# Patient Record
Sex: Male | Born: 1935
Health system: Southern US, Community
[De-identification: ages and names within clinical notes are randomized; demographics above are authoritative.]

## PROBLEM LIST (undated history)

## (undated) DIAGNOSIS — R0602 Shortness of breath: Secondary | ICD-10-CM

## (undated) DIAGNOSIS — I2510 Atherosclerotic heart disease of native coronary artery without angina pectoris: Secondary | ICD-10-CM

## (undated) DIAGNOSIS — K219 Gastro-esophageal reflux disease without esophagitis: Secondary | ICD-10-CM

## (undated) DIAGNOSIS — H269 Unspecified cataract: Secondary | ICD-10-CM

## (undated) DIAGNOSIS — I10 Essential (primary) hypertension: Secondary | ICD-10-CM

## (undated) DIAGNOSIS — M1990 Unspecified osteoarthritis, unspecified site: Secondary | ICD-10-CM

## (undated) DIAGNOSIS — J449 Chronic obstructive pulmonary disease, unspecified: Secondary | ICD-10-CM

## (undated) DIAGNOSIS — C801 Malignant (primary) neoplasm, unspecified: Secondary | ICD-10-CM

## (undated) DIAGNOSIS — D649 Anemia, unspecified: Secondary | ICD-10-CM

## (undated) DIAGNOSIS — Z87442 Personal history of urinary calculi: Secondary | ICD-10-CM

## (undated) DIAGNOSIS — E7800 Pure hypercholesterolemia, unspecified: Secondary | ICD-10-CM

## (undated) HISTORY — PX: TONSILLECTOMY: SUR1361

## (undated) HISTORY — DX: Unspecified cataract: H26.9

## (undated) HISTORY — PX: OTHER SURGICAL HISTORY: SHX169

## (undated) HISTORY — PX: EYE SURGERY: SHX253

---

## 1989-05-26 HISTORY — PX: CORONARY ARTERY BYPASS GRAFT: SHX141

## 2009-05-26 HISTORY — PX: CORONARY ANGIOPLASTY: SHX604

## 2011-06-26 DIAGNOSIS — I10 Essential (primary) hypertension: Secondary | ICD-10-CM | POA: Diagnosis not present

## 2011-06-26 DIAGNOSIS — E782 Mixed hyperlipidemia: Secondary | ICD-10-CM | POA: Diagnosis not present

## 2011-06-26 DIAGNOSIS — I2510 Atherosclerotic heart disease of native coronary artery without angina pectoris: Secondary | ICD-10-CM | POA: Diagnosis not present

## 2011-07-28 DIAGNOSIS — E119 Type 2 diabetes mellitus without complications: Secondary | ICD-10-CM | POA: Diagnosis not present

## 2012-03-05 DIAGNOSIS — H2510 Age-related nuclear cataract, unspecified eye: Secondary | ICD-10-CM | POA: Diagnosis not present

## 2012-03-05 DIAGNOSIS — H524 Presbyopia: Secondary | ICD-10-CM | POA: Diagnosis not present

## 2012-03-05 DIAGNOSIS — H25019 Cortical age-related cataract, unspecified eye: Secondary | ICD-10-CM | POA: Diagnosis not present

## 2012-03-05 DIAGNOSIS — H43399 Other vitreous opacities, unspecified eye: Secondary | ICD-10-CM | POA: Diagnosis not present

## 2012-04-06 DIAGNOSIS — E782 Mixed hyperlipidemia: Secondary | ICD-10-CM | POA: Diagnosis not present

## 2012-04-06 DIAGNOSIS — L570 Actinic keratosis: Secondary | ICD-10-CM | POA: Diagnosis not present

## 2012-04-12 DIAGNOSIS — H2589 Other age-related cataract: Secondary | ICD-10-CM | POA: Diagnosis not present

## 2012-04-12 DIAGNOSIS — H35369 Drusen (degenerative) of macula, unspecified eye: Secondary | ICD-10-CM | POA: Diagnosis not present

## 2012-04-13 ENCOUNTER — Encounter (HOSPITAL_COMMUNITY): Payer: Self-pay | Admitting: Pharmacy Technician

## 2012-04-16 ENCOUNTER — Encounter (HOSPITAL_COMMUNITY)
Admission: RE | Admit: 2012-04-16 | Discharge: 2012-04-16 | Disposition: A | Discharge status: Home / Self Care | Payer: Medicare Other | Source: Ambulatory Visit | Attending: Ophthalmology | Admitting: Ophthalmology

## 2012-04-16 ENCOUNTER — Encounter (HOSPITAL_COMMUNITY): Payer: Self-pay

## 2012-04-16 ENCOUNTER — Other Ambulatory Visit: Payer: Self-pay

## 2012-04-16 DIAGNOSIS — H2589 Other age-related cataract: Secondary | ICD-10-CM | POA: Diagnosis not present

## 2012-04-16 DIAGNOSIS — J449 Chronic obstructive pulmonary disease, unspecified: Secondary | ICD-10-CM | POA: Diagnosis not present

## 2012-04-16 DIAGNOSIS — I10 Essential (primary) hypertension: Secondary | ICD-10-CM | POA: Diagnosis not present

## 2012-04-16 HISTORY — DX: Chronic obstructive pulmonary disease, unspecified: J44.9

## 2012-04-16 LAB — BASIC METABOLIC PANEL
BUN: 24 mg/dL — ABNORMAL HIGH (ref 6–23)
Creatinine, Ser: 1.15 mg/dL (ref 0.50–1.35)

## 2012-04-16 LAB — HEMOGLOBIN AND HEMATOCRIT, BLOOD
HCT: 39.1 % (ref 39.0–52.0)
Hemoglobin: 13.8 g/dL (ref 13.0–17.0)

## 2012-04-16 NOTE — Patient Instructions (Addendum)
Your procedure is scheduled on:  04/29/2012  Report to [facility] at  7:00    AM.  Call this number if you have problems the morning of surgery: [phone]   Remember:   Do not eat or drink :After Midnight.    Take these medicines the morning of surgery with A SIP OF WATER: Amlodipine, Lisinopril, Theodur and use albuterol inhaler   Do not wear jewelry, make-up or nail polish.  Do not wear lotions, powders, or perfumes. You may wear deodorant.  Do not shave 48 hours prior to surgery.  Do not bring valuables to the hospital.  Contacts, dentures or bridgework may not be worn into surgery.  Patients discharged the day of surgery will not be allowed to drive home.  Name and phone number of your driver:    Please read over the following fact sheets that you were given: Pain Booklet, Surgical Site Infection Prevention, Anesthesia Post-op Instructions and Care and Recovery After Surgery  Cataract Surgery  A cataract is a clouding of the lens of the eye. When a lens becomes cloudy, vision is reduced based on the degree and nature of the clouding. Surgery may be needed to improve vision. Surgery removes the cloudy lens and usually replaces it with a substitute lens (intraocular lens, IOL). LET YOUR EYE DOCTOR KNOW ABOUT:  Allergies to food or medicine.   Medicines taken including herbs, eyedrops, over-the-counter medicines, and creams.   Use of steroids (by mouth or creams).   Previous problems with anesthetics or numbing medicine.   History of bleeding problems or blood clots.   Previous surgery.   Other health problems, including diabetes and kidney problems.   Possibility of pregnancy, if this applies.  RISKS AND COMPLICATIONS  Infection.   Inflammation of the eyeball (endophthalmitis) that can spread to both eyes (sympathetic ophthalmia).   Poor wound healing.   If an IOL is inserted, it can later fall out of proper position. This is very uncommon.   Clouding of the part  of your eye that holds an IOL in place. This is called an "after-cataract." These are uncommon, but easily treated.  BEFORE THE PROCEDURE  Do not eat or drink anything except small amounts of water for 8 to 12 before your surgery, or as directed by your caregiver.   Unless you are told otherwise, continue any eyedrops you have been prescribed.   Talk to your primary caregiver about all other medicines that you take (both prescription and non-prescription). In some cases, you may need to stop or change medicines near the time of your surgery. This is most important if you are taking blood-thinning medicine.Do not stop medicines unless you are told to do so.   Arrange for someone to drive you to and from the procedure.   Do not put contact lenses in either eye on the day of your surgery.  PROCEDURE There is more than one method for safely removing a cataract. Your doctor can explain the differences and help determine which is best for you. Phacoemulsification surgery is the most common form of cataract surgery.  An injection is given behind the eye or eyedrops are given to make this a painless procedure.   A small cut (incision) is made on the edge of the clear, dome-shaped surface that covers the front of the eye (cornea).   A tiny probe is painlessly inserted into the eye. This device gives off ultrasound waves that soften and break up the cloudy center of the  lens. This makes it easier for the cloudy lens to be removed by suction.   An IOL may be implanted.   The normal lens of the eye is covered by a clear capsule. Part of that capsule is intentionally left in the eye to support the IOL.   Your surgeon may or may not use stitches to close the incision.  There are other forms of cataract surgery that require a larger incision and stiches to close the eye. This approach is taken in cases where the doctor feels that the cataract cannot be easily removed using phacoemulsification. AFTER  THE PROCEDURE  When an IOL is implanted, it does not need care. It becomes a permanent part of your eye and cannot be seen or felt.   Your doctor will schedule follow-up exams to check on your progress.   Review your other medicines with your doctor to see which can be resumed after surgery.   Use eyedrops or take medicine as prescribed by your doctor.  Document Released: 05/01/2011 Document Reviewed: 04/28/2011 [facility] Patient Information 2012 [place], [place].  .Cataract Surgery Care After Refer to this sheet in the next few weeks. These instructions provide you with information on caring for yourself after your procedure. Your caregiver may also give you more specific instructions. Your treatment has been planned according to current medical practices, but problems sometimes occur. Call your caregiver if you have any problems or questions after your procedure.  HOME CARE INSTRUCTIONS   Avoid strenuous activities as directed by your caregiver.   Ask your caregiver when you can resume driving.   Use eyedrops or other medicines to help healing and control pressure inside your eye as directed by your caregiver.   Only take over-the-counter or prescription medicines for pain, discomfort, or fever as directed by your caregiver.   Do not to touch or rub your eyes.   You may be instructed to use a protective shield during the first few days and nights after surgery. If not, wear sunglasses to protect your eyes. This is to protect the eye from pressure or from being accidentally bumped.   Keep the area around your eye clean and dry. Avoid swimming or allowing water to hit you directly in the face while showering. Keep soap and shampoo out of your eyes.   Do not bend or lift heavy objects. Bending increases pressure in the eye. You can walk, climb stairs, and do light household chores.   Do not put a contact lens into the eye that had surgery until your caregiver says it is okay to do so.     Ask your doctor when you can return to work. This will depend on the kind of work that you do. If you work in a dusty environment, you may be advised to wear protective eyewear for a period of time.   Ask your caregiver when it will be safe to engage in sexual activity.   Continue with your regular eye exams as directed by your caregiver.  What to expect:  It is normal to feel itching and mild discomfort for a few days after cataract surgery. Some fluid discharge is also common, and your eye may be sensitive to light and touch.   After 1 to 2 days, even moderate discomfort should disappear. In most cases, healing will take about 6 weeks.   If you received an intraocular lens (IOL), you may notice that colors are very bright or have a blue tinge. Also, if you have  been in bright sunlight, everything may appear reddish for a few hours. If you see these color tinges, it is because your lens is clear and no longer cloudy. Within a few months after receiving an IOL, these extra colors should go away. When you have healed, you will probably need new glasses.  SEEK MEDICAL CARE IF:   You have increased bruising around your eye.   You have discomfort not helped by medicine.  SEEK IMMEDIATE MEDICAL CARE IF:   You have a fever.   You have a worsening or sudden vision loss.   You have redness, swelling, or increasing pain in the eye.   You have a thick discharge from the eye that had surgery.  MAKE SURE YOU:  Understand these instructions.   Will watch your condition.   Will get help right away if you are not doing well or get worse.  Document Released: 11/29/2004 Document Revised: 05/01/2011 Document Reviewed: 01/03/2011 [facility] Patient Information 2012 [place], [place].

## 2012-04-28 MED ORDER — TETRACAINE HCL 0.5 % OP SOLN
OPHTHALMIC | Status: AC
  Filled 2012-04-28: qty 2

## 2012-04-28 MED ORDER — LIDOCAINE HCL 3.5 % OP GEL
OPHTHALMIC | Status: AC
  Filled 2012-04-28: qty 5

## 2012-04-28 MED ORDER — PHENYLEPHRINE HCL 2.5 % OP SOLN
OPHTHALMIC | Status: AC
  Filled 2012-04-28: qty 2

## 2012-04-28 MED ORDER — LIDOCAINE HCL (PF) 1 % IJ SOLN
INTRAMUSCULAR | Status: AC
  Filled 2012-04-28: qty 2

## 2012-04-28 MED ORDER — NEOMYCIN-POLYMYXIN-DEXAMETH 3.5-10000-0.1 OP OINT
TOPICAL_OINTMENT | OPHTHALMIC | Status: AC
  Filled 2012-04-28: qty 3.5

## 2012-04-28 MED ORDER — CYCLOPENTOLATE-PHENYLEPHRINE 0.2-1 % OP SOLN
OPHTHALMIC | Status: AC
  Filled 2012-04-28: qty 2

## 2012-04-29 ENCOUNTER — Encounter (HOSPITAL_COMMUNITY): Payer: Self-pay | Admitting: *Deleted

## 2012-04-29 ENCOUNTER — Encounter (HOSPITAL_COMMUNITY): Payer: Self-pay | Admitting: Anesthesiology

## 2012-04-29 ENCOUNTER — Ambulatory Visit (HOSPITAL_COMMUNITY)
Admission: RE | Admit: 2012-04-29 | Discharge: 2012-04-29 | Disposition: A | Discharge status: Home / Self Care | Payer: Medicare Other | Source: Ambulatory Visit | Attending: Ophthalmology | Admitting: Ophthalmology

## 2012-04-29 ENCOUNTER — Ambulatory Visit (HOSPITAL_COMMUNITY): Payer: Medicare Other | Admitting: Anesthesiology

## 2012-04-29 ENCOUNTER — Encounter (HOSPITAL_COMMUNITY)
Admission: RE | Disposition: A | Discharge status: Home / Self Care | Payer: Self-pay | Source: Ambulatory Visit | Attending: Ophthalmology

## 2012-04-29 ENCOUNTER — Encounter (HOSPITAL_COMMUNITY): Payer: Self-pay | Admitting: Ophthalmology

## 2012-04-29 DIAGNOSIS — I10 Essential (primary) hypertension: Secondary | ICD-10-CM | POA: Diagnosis not present

## 2012-04-29 DIAGNOSIS — H2589 Other age-related cataract: Secondary | ICD-10-CM | POA: Insufficient documentation

## 2012-04-29 DIAGNOSIS — J4489 Other specified chronic obstructive pulmonary disease: Secondary | ICD-10-CM | POA: Insufficient documentation

## 2012-04-29 DIAGNOSIS — J449 Chronic obstructive pulmonary disease, unspecified: Secondary | ICD-10-CM | POA: Insufficient documentation

## 2012-04-29 DIAGNOSIS — H269 Unspecified cataract: Secondary | ICD-10-CM | POA: Diagnosis not present

## 2012-04-29 HISTORY — PX: CATARACT EXTRACTION W/PHACO: SHX586

## 2012-04-29 SURGERY — PHACOEMULSIFICATION, CATARACT, WITH IOL INSERTION
Anesthesia: Monitor Anesthesia Care | Site: Eye | Laterality: Right | Wound class: Clean

## 2012-04-29 MED ORDER — CYCLOPENTOLATE-PHENYLEPHRINE 0.2-1 % OP SOLN
1.0000 [drp] | OPHTHALMIC | Status: AC
  Administered 2012-04-29 (×3): 1 [drp] via OPHTHALMIC

## 2012-04-29 MED ORDER — PHENYLEPHRINE HCL 2.5 % OP SOLN
1.0000 [drp] | OPHTHALMIC | Status: AC
Start: 2012-04-29 — End: 2012-04-29
  Administered 2012-04-29 (×3): 1 [drp] via OPHTHALMIC

## 2012-04-29 MED ORDER — POVIDONE-IODINE 5 % OP SOLN
OPHTHALMIC | Status: DC | PRN
  Administered 2012-04-29: 1 via OPHTHALMIC

## 2012-04-29 MED ORDER — LACTATED RINGERS IV SOLN
INTRAVENOUS | Status: DC | PRN
  Administered 2012-04-29: 08:00:00 via INTRAVENOUS

## 2012-04-29 MED ORDER — PROVISC 10 MG/ML IO SOLN
INTRAOCULAR | Status: DC | PRN
  Administered 2012-04-29: 8.5 mg via INTRAOCULAR

## 2012-04-29 MED ORDER — MIDAZOLAM HCL 2 MG/2ML IJ SOLN
1.0000 mg | INTRAMUSCULAR | Status: DC | PRN
  Administered 2012-04-29: 2 mg via INTRAVENOUS

## 2012-04-29 MED ORDER — TETRACAINE HCL 0.5 % OP SOLN
1.0000 [drp] | OPHTHALMIC | Status: AC
  Administered 2012-04-29 (×3): 1 [drp] via OPHTHALMIC

## 2012-04-29 MED ORDER — EPINEPHRINE HCL 1 MG/ML IJ SOLN
INTRAMUSCULAR | Status: AC
  Filled 2012-04-29: qty 1

## 2012-04-29 MED ORDER — LIDOCAINE 3.5 % OP GEL OPTIME - NO CHARGE
OPHTHALMIC | Status: DC | PRN
  Administered 2012-04-29: 1 [drp] via OPHTHALMIC

## 2012-04-29 MED ORDER — LIDOCAINE HCL (PF) 1 % IJ SOLN
INTRAMUSCULAR | Status: DC | PRN
  Administered 2012-04-29: .7 mL

## 2012-04-29 MED ORDER — MIDAZOLAM HCL 2 MG/2ML IJ SOLN
INTRAMUSCULAR | Status: AC
Start: 2012-04-29 — End: 2012-04-29
  Filled 2012-04-29: qty 2

## 2012-04-29 MED ORDER — NEOMYCIN-POLYMYXIN-DEXAMETH 0.1 % OP OINT
TOPICAL_OINTMENT | OPHTHALMIC | Status: DC | PRN
  Administered 2012-04-29: 1 via OPHTHALMIC

## 2012-04-29 MED ORDER — LIDOCAINE HCL 3.5 % OP GEL
1.0000 "application " | Freq: Once | OPHTHALMIC | Status: AC
  Administered 2012-04-29: 1 via OPHTHALMIC

## 2012-04-29 MED ORDER — BSS IO SOLN
INTRAOCULAR | Status: DC | PRN
  Administered 2012-04-29: 15 mL via INTRAOCULAR

## 2012-04-29 MED ORDER — LACTATED RINGERS IV SOLN
INTRAVENOUS | Status: DC
  Administered 2012-04-29: 1000 mL via INTRAVENOUS

## 2012-04-29 MED ORDER — EPINEPHRINE HCL 1 MG/ML IJ SOLN
INTRAOCULAR | Status: DC | PRN
  Administered 2012-04-29: 08:00:00

## 2012-04-29 SURGICAL SUPPLY — 32 items

## 2012-04-29 NOTE — Op Note (Signed)
NAME:  Gabriel Mcdonald, [name]                 ACCOUNT NO.:  [id number]  MEDICAL RECORD NO.:  [id number]  LOCATION:  [facility]                          FACILITY:  [facility]  PHYSICIAN:  Susanne Greenhouse, MD       DATE OF BIRTH:  13-Mar-1932  DATE OF PROCEDURE:  04/29/2012 DATE OF DISCHARGE:  04/29/2012                              OPERATIVE REPORT   PREOPERATIVE DIAGNOSIS:  Combined cataract, right eye, diagnosis code 366.19.  POSTOPERATIVE DIAGNOSIS:  Combined cataract, right eye, diagnosis code 366.19.  OPERATION PERFORMED:  Phacoemulsification with posterior chamber intraocular lens implantation, right eye.  SURGEON:  Bonne Dolores. Daymond Cordts, MD  ANESTHESIA:  Topical with monitored anesthesia care and IV sedation.  OPERATIVE SUMMARY:  In the preoperative area, dilating drops were placed into the right eye.  The patient was then brought into the operating room where he was placed under general anesthesia.  The eye was then prepped and draped.  Beginning with a 75 blade, a paracentesis port was made at the surgeon's 2 o'clock position.  The anterior chamber was then filled with a 1% nonpreserved lidocaine solution with epinephrine.  This was followed by Viscoat to deepen the chamber.  A small fornix-based peritomy was performed superiorly.  Next, a single iris hook was placed through the limbus superiorly.  A 2.4-mm keratome blade was then used to make a clear corneal incision over the iris hook.  A bent cystotome needle and Utrata forceps were used to create a continuous tear capsulotomy.  Hydrodissection was performed using balanced salt solution on a fine cannula.  The lens nucleus was then removed using phacoemulsification in a quadrant cracking technique.  The cortical material was then removed with irrigation and aspiration.  The capsular bag and anterior chamber were refilled with Provisc.  The wound was widened to approximately 3 mm and a posterior chamber intraocular lens was placed into the capsular  bag without difficulty using an [employer] lens injecting system.  A single 10-0 nylon suture was then used to close the incision as well as stromal hydration.  The Provisc was removed from the anterior chamber and capsular bag with irrigation and aspiration.  At this point, the wounds were tested for leak, which were negative.  The anterior chamber remained deep and stable.  The patient tolerated the procedure well.  There were no operative complications, and he awoke from general anesthesia without problem.  No surgical specimens.  Prosthetic device used is a Theme park manager, model EnVista, model number MX60, power of 20.0, serial number is [id number].          ______________________________ Susanne Greenhouse, MD     KEH/MEDQ  D:  04/29/2012  T:  04/29/2012  Job:  [id number]

## 2012-04-29 NOTE — Anesthesia Preprocedure Evaluation (Signed)
Anesthesia Evaluation  Patient identified by MRN, date of birth, ID band Patient awake    Reviewed: Allergy & Precautions, H&P , NPO status , Patient's Chart, lab work & pertinent test results  Airway Mallampati: II      Dental  (+) Teeth Intact   Pulmonary COPD COPD inhaler, former smoker,  breath sounds clear to auscultation        Cardiovascular hypertension, - angina+ CAD and + CABG Rhythm:Regular Rate:Normal     Neuro/Psych    GI/Hepatic negative GI ROS,   Endo/Other    Renal/GU      Musculoskeletal   Abdominal   Peds  Hematology   Anesthesia Other Findings   Reproductive/Obstetrics                           Anesthesia Physical Anesthesia Plan  ASA: III  Anesthesia Plan: MAC   Post-op Pain Management:    Induction: Intravenous  Airway Management Planned: Nasal Cannula  Additional Equipment:   Intra-op Plan:   Post-operative Plan:   Informed Consent: I have reviewed the patients History and Physical, chart, labs and discussed the procedure including the risks, benefits and alternatives for the proposed anesthesia with the patient or authorized representative who has indicated his/her understanding and acceptance.     Plan Discussed with:   Anesthesia Plan Comments:         Anesthesia Quick Evaluation

## 2012-04-29 NOTE — Transfer of Care (Signed)
  Anesthesia Post-op Note  Patient: Gabriel Mcdonald  Procedure(s) Performed: Procedure(s) (LRB) with comments: CATARACT EXTRACTION PHACO AND INTRAOCULAR LENS PLACEMENT (IOC) (Right) - CDE:10.09  Patient Location:short stay  Anesthesia Type: MAC  Level of Consciousness: awake, alert , oriented and patient cooperative  Airway and Oxygen Therapy: Patient Spontanous Breathing   Post-op Pain: mild  Post-op Assessment: Post-op Vital signs reviewed, Patient's Cardiovascular Status Stable, Respiratory Function Stable, Patent Airway and No signs of Nausea or vomiting  Post-op Vital Signs: Reviewed and stable  Complications: No apparent anesthesia complications

## 2012-04-29 NOTE — Preoperative (Signed)
Beta Blockers   Reason not to administer Beta Blockers:Not Applicable 

## 2012-04-29 NOTE — H&P (Signed)
I have reviewed the H&P, the patient was re-examined, and I have identified no interval changes in medical condition and plan of care since the history and physical of record  

## 2012-04-29 NOTE — Brief Op Note (Signed)
Pre-Op Dx: Cataract OD Post-Op Dx: Cataract OD Surgeon: Francheska Villeda Anesthesia: Topical with MAC Surgery: Cataract Extraction with Intraocular lens Implant OD Implant: B&L enVista Specimen: None Complications: None 

## 2012-04-29 NOTE — Anesthesia Postprocedure Evaluation (Signed)
  Anesthesia Post-op Note  Patient: Gabriel Mcdonald  Procedure(s) Performed: Procedure(s) (LRB) with comments: CATARACT EXTRACTION PHACO AND INTRAOCULAR LENS PLACEMENT (IOC) (Right) - CDE:10.09  Patient Location:short stay  Anesthesia Type: MAC  Level of Consciousness: awake, alert , oriented and patient cooperative  Airway and Oxygen Therapy: Patient Spontanous Breathing   Post-op Pain: mild  Post-op Assessment: Post-op Vital signs reviewed, Patient's Cardiovascular Status Stable, Respiratory Function Stable, Patent Airway and No signs of Nausea or vomiting  Post-op Vital Signs: Reviewed and stable  Complications: No apparent anesthesia complications  

## 2012-05-03 ENCOUNTER — Encounter (HOSPITAL_COMMUNITY): Payer: Self-pay | Admitting: Ophthalmology

## 2012-05-06 ENCOUNTER — Encounter (HOSPITAL_COMMUNITY): Payer: Self-pay | Admitting: Pharmacy Technician

## 2012-05-10 DIAGNOSIS — H2589 Other age-related cataract: Secondary | ICD-10-CM | POA: Diagnosis not present

## 2012-05-10 DIAGNOSIS — H5200 Hypermetropia, unspecified eye: Secondary | ICD-10-CM | POA: Diagnosis not present

## 2012-05-10 DIAGNOSIS — H52229 Regular astigmatism, unspecified eye: Secondary | ICD-10-CM | POA: Diagnosis not present

## 2012-05-10 DIAGNOSIS — H353190 Nonexudative age-related macular degeneration, unspecified eye, stage unspecified: Secondary | ICD-10-CM | POA: Diagnosis not present

## 2012-05-11 ENCOUNTER — Encounter (HOSPITAL_COMMUNITY): Admission: RE | Admit: 2012-05-11 | Discharge: 2012-05-11 | Payer: Medicare Other | Source: Ambulatory Visit

## 2012-05-31 ENCOUNTER — Encounter (HOSPITAL_COMMUNITY): Admission: RE | Admit: 2012-05-31 | Discharge: 2012-05-31 | Payer: Medicare Other | Source: Ambulatory Visit

## 2012-06-02 MED ORDER — FENTANYL CITRATE 0.05 MG/ML IJ SOLN: 25.0000 ug | INTRAMUSCULAR | Status: DC | PRN

## 2012-06-02 MED ORDER — PHENYLEPHRINE HCL 2.5 % OP SOLN: 1.0000 [drp] | OPHTHALMIC | Status: AC

## 2012-06-02 MED ORDER — ONDANSETRON HCL 4 MG/2ML IJ SOLN: 4.0000 mg | Freq: Once | INTRAMUSCULAR | Status: AC | PRN

## 2012-06-02 MED ORDER — CYCLOPENTOLATE-PHENYLEPHRINE 0.2-1 % OP SOLN: 1.0000 [drp] | OPHTHALMIC | Status: AC

## 2012-06-02 MED ORDER — LIDOCAINE HCL 3.5 % OP GEL: 1.0000 "application " | Freq: Once | OPHTHALMIC | Status: DC

## 2012-06-02 MED ORDER — TETRACAINE HCL 0.5 % OP SOLN: 1.0000 [drp] | OPHTHALMIC | Status: AC

## 2012-06-04 DIAGNOSIS — I10 Essential (primary) hypertension: Secondary | ICD-10-CM | POA: Diagnosis not present

## 2012-06-04 MED ORDER — TETRACAINE HCL 0.5 % OP SOLN
OPHTHALMIC | Status: AC
  Filled 2012-06-04: qty 2

## 2012-06-04 MED ORDER — LIDOCAINE HCL 3.5 % OP GEL
OPHTHALMIC | Status: AC
  Filled 2012-06-04: qty 5

## 2012-06-04 MED ORDER — CYCLOPENTOLATE-PHENYLEPHRINE 0.2-1 % OP SOLN
OPHTHALMIC | Status: AC
  Filled 2012-06-04: qty 2

## 2012-06-04 MED ORDER — LIDOCAINE HCL (PF) 1 % IJ SOLN
INTRAMUSCULAR | Status: AC
  Filled 2012-06-04: qty 2

## 2012-06-04 MED ORDER — NEOMYCIN-POLYMYXIN-DEXAMETH 3.5-10000-0.1 OP OINT
TOPICAL_OINTMENT | OPHTHALMIC | Status: AC
  Filled 2012-06-04: qty 3.5

## 2012-06-04 MED ORDER — PHENYLEPHRINE HCL 2.5 % OP SOLN
OPHTHALMIC | Status: AC
  Filled 2012-06-04: qty 2

## 2012-06-07 ENCOUNTER — Ambulatory Visit (HOSPITAL_COMMUNITY): Payer: Medicare Other | Admitting: Anesthesiology

## 2012-06-07 ENCOUNTER — Encounter (HOSPITAL_COMMUNITY): Payer: Self-pay | Admitting: Anesthesiology

## 2012-06-07 ENCOUNTER — Ambulatory Visit (HOSPITAL_COMMUNITY)
Admission: RE | Admit: 2012-06-07 | Discharge: 2012-06-07 | Disposition: A | Discharge status: Home Health | Payer: Medicare Other | Source: Ambulatory Visit | Attending: Ophthalmology | Admitting: Ophthalmology

## 2012-06-07 ENCOUNTER — Encounter (HOSPITAL_COMMUNITY)
Admission: RE | Disposition: A | Discharge status: Home Health | Payer: Self-pay | Source: Ambulatory Visit | Attending: Ophthalmology

## 2012-06-07 ENCOUNTER — Encounter (HOSPITAL_COMMUNITY): Payer: Self-pay | Admitting: *Deleted

## 2012-06-07 DIAGNOSIS — J449 Chronic obstructive pulmonary disease, unspecified: Secondary | ICD-10-CM | POA: Insufficient documentation

## 2012-06-07 DIAGNOSIS — Z951 Presence of aortocoronary bypass graft: Secondary | ICD-10-CM | POA: Insufficient documentation

## 2012-06-07 DIAGNOSIS — J4489 Other specified chronic obstructive pulmonary disease: Secondary | ICD-10-CM | POA: Insufficient documentation

## 2012-06-07 DIAGNOSIS — H524 Presbyopia: Secondary | ICD-10-CM | POA: Diagnosis not present

## 2012-06-07 DIAGNOSIS — H2589 Other age-related cataract: Secondary | ICD-10-CM | POA: Insufficient documentation

## 2012-06-07 DIAGNOSIS — H2510 Age-related nuclear cataract, unspecified eye: Secondary | ICD-10-CM | POA: Diagnosis not present

## 2012-06-07 DIAGNOSIS — I10 Essential (primary) hypertension: Secondary | ICD-10-CM | POA: Insufficient documentation

## 2012-06-07 DIAGNOSIS — H269 Unspecified cataract: Secondary | ICD-10-CM | POA: Diagnosis not present

## 2012-06-07 HISTORY — PX: CATARACT EXTRACTION W/PHACO: SHX586

## 2012-06-07 SURGERY — PHACOEMULSIFICATION, CATARACT, WITH IOL INSERTION
Anesthesia: Monitor Anesthesia Care | Site: Eye | Laterality: Left | Wound class: Clean

## 2012-06-07 MED ORDER — CYCLOPENTOLATE-PHENYLEPHRINE 0.2-1 % OP SOLN
1.0000 [drp] | OPHTHALMIC | Status: AC
  Administered 2012-06-07 (×3): 1 [drp] via OPHTHALMIC

## 2012-06-07 MED ORDER — PHENYLEPHRINE HCL 2.5 % OP SOLN
1.0000 [drp] | OPHTHALMIC | Status: AC
  Administered 2012-06-07 (×3): 1 [drp] via OPHTHALMIC

## 2012-06-07 MED ORDER — POVIDONE-IODINE 5 % OP SOLN
OPHTHALMIC | Status: DC | PRN
  Administered 2012-06-07: 1 via OPHTHALMIC

## 2012-06-07 MED ORDER — MIDAZOLAM HCL 2 MG/2ML IJ SOLN
1.0000 mg | INTRAMUSCULAR | Status: DC | PRN
  Administered 2012-06-07: 2 mg via INTRAVENOUS

## 2012-06-07 MED ORDER — BSS IO SOLN
INTRAOCULAR | Status: DC | PRN
  Administered 2012-06-07: 15 mL via INTRAOCULAR

## 2012-06-07 MED ORDER — TETRACAINE HCL 0.5 % OP SOLN
1.0000 [drp] | OPHTHALMIC | Status: AC
  Administered 2012-06-07 (×3): 1 [drp] via OPHTHALMIC

## 2012-06-07 MED ORDER — LIDOCAINE HCL (PF) 1 % IJ SOLN
INTRAMUSCULAR | Status: DC | PRN
  Administered 2012-06-07: .4 mL

## 2012-06-07 MED ORDER — NEOMYCIN-POLYMYXIN-DEXAMETH 0.1 % OP OINT
TOPICAL_OINTMENT | OPHTHALMIC | Status: DC | PRN
  Administered 2012-06-07: 1 via OPHTHALMIC

## 2012-06-07 MED ORDER — MIDAZOLAM HCL 2 MG/2ML IJ SOLN
INTRAMUSCULAR | Status: AC
  Filled 2012-06-07: qty 2

## 2012-06-07 MED ORDER — EPINEPHRINE HCL 1 MG/ML IJ SOLN
INTRAMUSCULAR | Status: AC
  Filled 2012-06-07: qty 1

## 2012-06-07 MED ORDER — LIDOCAINE HCL 3.5 % OP GEL
1.0000 "application " | Freq: Once | OPHTHALMIC | Status: AC
  Administered 2012-06-07: 1 via OPHTHALMIC

## 2012-06-07 MED ORDER — PROVISC 10 MG/ML IO SOLN
INTRAOCULAR | Status: DC | PRN
  Administered 2012-06-07: 8.5 mg via INTRAOCULAR

## 2012-06-07 MED ORDER — LACTATED RINGERS IV SOLN
INTRAVENOUS | Status: DC
  Administered 2012-06-07: 1000 mL via INTRAVENOUS

## 2012-06-07 MED ORDER — EPINEPHRINE HCL 1 MG/ML IJ SOLN
INTRAOCULAR | Status: DC | PRN
  Administered 2012-06-07: 10:00:00

## 2012-06-07 MED ORDER — LIDOCAINE 3.5 % OP GEL OPTIME - NO CHARGE
OPHTHALMIC | Status: DC | PRN
  Administered 2012-06-07: 1 [drp] via OPHTHALMIC

## 2012-06-07 SURGICAL SUPPLY — 32 items

## 2012-06-07 NOTE — H&P (Signed)
I have reviewed the H&P, the patient was re-examined, and I have identified no interval changes in medical condition and plan of care since the history and physical of record  

## 2012-06-07 NOTE — Transfer of Care (Signed)
Immediate Anesthesia Transfer of Care Note  Patient: Gabriel Mcdonald  Procedure(s) Performed: Procedure(s) (LRB) with comments: CATARACT EXTRACTION PHACO AND INTRAOCULAR LENS PLACEMENT (IOC) (Left) - CDE:16.19  Patient Location: PACU and Short Stay  Anesthesia Type:MAC  Level of Consciousness: awake  Airway & Oxygen Therapy: Patient Spontanous Breathing  Post-op Assessment: Report given to PACU RN  Post vital signs: Reviewed  Complications: No apparent anesthesia complications

## 2012-06-07 NOTE — Anesthesia Preprocedure Evaluation (Signed)
Anesthesia Evaluation  Patient identified by MRN, date of birth, ID band Patient awake    Reviewed: Allergy & Precautions, H&P , NPO status , Patient's Chart, lab work & pertinent test results  Airway Mallampati: II      Dental  (+) Teeth Intact   Pulmonary COPD COPD inhaler, former smoker,  breath sounds clear to auscultation        Cardiovascular hypertension, - angina+ CAD and + CABG Rhythm:Regular Rate:Normal     Neuro/Psych    GI/Hepatic negative GI ROS,   Endo/Other    Renal/GU      Musculoskeletal   Abdominal   Peds  Hematology   Anesthesia Other Findings   Reproductive/Obstetrics                           Anesthesia Physical Anesthesia Plan  ASA: III  Anesthesia Plan: MAC   Post-op Pain Management:    Induction: Intravenous  Airway Management Planned: Nasal Cannula  Additional Equipment:   Intra-op Plan:   Post-operative Plan:   Informed Consent: I have reviewed the patients History and Physical, chart, labs and discussed the procedure including the risks, benefits and alternatives for the proposed anesthesia with the patient or authorized representative who has indicated his/her understanding and acceptance.     Plan Discussed with:   Anesthesia Plan Comments:         Anesthesia Quick Evaluation  

## 2012-06-07 NOTE — Anesthesia Postprocedure Evaluation (Signed)
  Anesthesia Post-op Note  Patient: Gabriel Mcdonald  Procedure(s) Performed: Procedure(s) (LRB) with comments: CATARACT EXTRACTION PHACO AND INTRAOCULAR LENS PLACEMENT (IOC) (Left) - CDE:16.19  Patient Location: PACU and Short Stay  Anesthesia Type:MAC  Level of Consciousness: awake  Airway and Oxygen Therapy: Patient Spontanous Breathing  Post-op Pain: none  Post-op Assessment: Post-op Vital signs reviewed, Patient's Cardiovascular Status Stable, Respiratory Function Stable, Patent Airway and No signs of Nausea or vomiting  Post-op Vital Signs: Reviewed and stable  Complications: No apparent anesthesia complications

## 2012-06-07 NOTE — Brief Op Note (Signed)
Pre-Op Dx: Cataract OS Post-Op Dx: Cataract OS Surgeon: Danielle Mink Anesthesia: Topical with MAC Surgery: Cataract Extraction with Intraocular lens Implant OS Implant: B&L enVista Specimen: None Complications: None 

## 2012-06-08 NOTE — Addendum Note (Signed)
Addendum  created 06/08/12 1439 by Franco Nones, CRNA   Modules edited:Charges VN

## 2012-06-08 NOTE — Op Note (Signed)
NAME:  Gabriel Mcdonald, Gabriel Mcdonald                 ACCOUNT NO.:  [id number]  MEDICAL RECORD NO.:  [id number]  LOCATION:  [facility]                          FACILITY:  [facility]  PHYSICIAN:  Susanne Greenhouse, MD       DATE OF BIRTH:  08-05-1931  DATE OF PROCEDURE:  06/07/2012 DATE OF DISCHARGE:  06/07/2012                              OPERATIVE REPORT   PREOPERATIVE DIAGNOSIS:  Combined cataract, left eye, diagnosis code 366.19.  POSTOPERATIVE DIAGNOSIS:  Combined cataract, left eye, diagnosis code 366.19.  OPERATION PERFORMED:  Phacoemulsification with posterior chamber intraocular lens implantation, left eye.  SURGEON:  Bonne Dolores. Quaid Yeakle, MD  ANESTHESIA:  Topical with monitored anesthesia care and IV sedation.  OPERATIVE SUMMARY:  In the preoperative area, dilating drops were placed into the left eye.  The patient was then brought into the operating room where he was placed under general anesthesia.  The eye was then prepped and draped.  Beginning with a 75 blade, a paracentesis port was made at the surgeon's 2 o'clock position.  The anterior chamber was then filled with a 1% nonpreserved lidocaine solution with epinephrine.  This was followed by Viscoat to deepen the chamber.  A small fornix-based peritomy was performed superiorly.  Next, a single iris hook was placed through the limbus superiorly.  A 2.4-mm keratome blade was then used to make a clear corneal incision over the iris hook.  A bent cystotome needle and Utrata forceps were used to create a continuous tear capsulotomy.  Hydrodissection was performed using balanced salt solution on a fine cannula.  The lens nucleus was then removed using phacoemulsification in a quadrant cracking technique.  The cortical material was then removed with irrigation and aspiration.  The capsular bag and anterior chamber were refilled with Provisc.  The wound was widened to approximately 3 mm and a posterior chamber intraocular lens was placed into the capsular bag  without difficulty using an [employer] lens injecting system.  A single 10-0 nylon suture was then used to close the incision as well as stromal hydration.  The Provisc was removed from the anterior chamber and capsular bag with irrigation and aspiration.  At this point, the wounds were tested for leak, which were negative.  The anterior chamber remained deep and stable.  The patient tolerated the procedure well.  There were no operative complications, and he awoke from general anesthesia without problem.  No surgical specimens.  Prosthetic device used is a Theme park manager, model EnVista, model number MX60, power of 20.0, serial number is [id number].          ______________________________ Susanne Greenhouse, MD     KEH/MEDQ  D:  06/07/2012  T:  06/08/2012  Job:  [id number]

## 2012-06-09 ENCOUNTER — Encounter (HOSPITAL_COMMUNITY): Payer: Self-pay | Admitting: Ophthalmology

## 2012-08-23 DIAGNOSIS — H04129 Dry eye syndrome of unspecified lacrimal gland: Secondary | ICD-10-CM | POA: Diagnosis not present

## 2012-08-23 DIAGNOSIS — H26499 Other secondary cataract, unspecified eye: Secondary | ICD-10-CM | POA: Diagnosis not present

## 2012-08-23 DIAGNOSIS — H353190 Nonexudative age-related macular degeneration, unspecified eye, stage unspecified: Secondary | ICD-10-CM | POA: Diagnosis not present

## 2012-09-06 DIAGNOSIS — E782 Mixed hyperlipidemia: Secondary | ICD-10-CM | POA: Diagnosis not present

## 2012-09-06 DIAGNOSIS — I10 Essential (primary) hypertension: Secondary | ICD-10-CM | POA: Diagnosis not present

## 2012-09-06 DIAGNOSIS — Z125 Encounter for screening for malignant neoplasm of prostate: Secondary | ICD-10-CM | POA: Diagnosis not present

## 2012-09-27 DIAGNOSIS — H04129 Dry eye syndrome of unspecified lacrimal gland: Secondary | ICD-10-CM | POA: Diagnosis not present

## 2012-10-26 DIAGNOSIS — S61409A Unspecified open wound of unspecified hand, initial encounter: Secondary | ICD-10-CM | POA: Diagnosis not present

## 2012-10-28 DIAGNOSIS — S60559A Superficial foreign body of unspecified hand, initial encounter: Secondary | ICD-10-CM | POA: Diagnosis not present

## 2012-10-28 DIAGNOSIS — M79609 Pain in unspecified limb: Secondary | ICD-10-CM | POA: Diagnosis not present

## 2012-12-06 DIAGNOSIS — L570 Actinic keratosis: Secondary | ICD-10-CM | POA: Diagnosis not present

## 2013-01-27 DIAGNOSIS — Z0000 Encounter for general adult medical examination without abnormal findings: Secondary | ICD-10-CM | POA: Diagnosis not present

## 2013-01-27 DIAGNOSIS — L570 Actinic keratosis: Secondary | ICD-10-CM | POA: Diagnosis not present

## 2013-01-27 DIAGNOSIS — Z125 Encounter for screening for malignant neoplasm of prostate: Secondary | ICD-10-CM | POA: Diagnosis not present

## 2013-02-03 DIAGNOSIS — K644 Residual hemorrhoidal skin tags: Secondary | ICD-10-CM | POA: Diagnosis not present

## 2013-02-04 DIAGNOSIS — Z951 Presence of aortocoronary bypass graft: Secondary | ICD-10-CM | POA: Diagnosis not present

## 2013-02-04 DIAGNOSIS — K219 Gastro-esophageal reflux disease without esophagitis: Secondary | ICD-10-CM | POA: Diagnosis not present

## 2013-02-04 DIAGNOSIS — Z7982 Long term (current) use of aspirin: Secondary | ICD-10-CM | POA: Diagnosis not present

## 2013-02-04 DIAGNOSIS — Z79899 Other long term (current) drug therapy: Secondary | ICD-10-CM | POA: Diagnosis not present

## 2013-02-04 DIAGNOSIS — K644 Residual hemorrhoidal skin tags: Secondary | ICD-10-CM | POA: Diagnosis not present

## 2013-02-04 DIAGNOSIS — Z9861 Coronary angioplasty status: Secondary | ICD-10-CM | POA: Diagnosis not present

## 2013-02-04 DIAGNOSIS — K648 Other hemorrhoids: Secondary | ICD-10-CM | POA: Diagnosis not present

## 2013-02-04 DIAGNOSIS — I2510 Atherosclerotic heart disease of native coronary artery without angina pectoris: Secondary | ICD-10-CM | POA: Diagnosis not present

## 2013-02-04 DIAGNOSIS — I10 Essential (primary) hypertension: Secondary | ICD-10-CM | POA: Diagnosis not present

## 2013-02-09 DIAGNOSIS — K219 Gastro-esophageal reflux disease without esophagitis: Secondary | ICD-10-CM | POA: Diagnosis not present

## 2013-02-09 DIAGNOSIS — K648 Other hemorrhoids: Secondary | ICD-10-CM | POA: Diagnosis not present

## 2013-02-09 DIAGNOSIS — Z79899 Other long term (current) drug therapy: Secondary | ICD-10-CM | POA: Diagnosis not present

## 2013-02-09 DIAGNOSIS — K644 Residual hemorrhoidal skin tags: Secondary | ICD-10-CM | POA: Diagnosis not present

## 2013-02-09 DIAGNOSIS — Z951 Presence of aortocoronary bypass graft: Secondary | ICD-10-CM | POA: Diagnosis not present

## 2013-02-09 DIAGNOSIS — I10 Essential (primary) hypertension: Secondary | ICD-10-CM | POA: Diagnosis not present

## 2013-02-09 DIAGNOSIS — Z7982 Long term (current) use of aspirin: Secondary | ICD-10-CM | POA: Diagnosis not present

## 2013-02-09 DIAGNOSIS — K649 Unspecified hemorrhoids: Secondary | ICD-10-CM | POA: Diagnosis not present

## 2013-03-01 DIAGNOSIS — Z23 Encounter for immunization: Secondary | ICD-10-CM | POA: Diagnosis not present

## 2013-03-08 DIAGNOSIS — L570 Actinic keratosis: Secondary | ICD-10-CM | POA: Diagnosis not present

## 2013-04-04 DIAGNOSIS — M549 Dorsalgia, unspecified: Secondary | ICD-10-CM | POA: Diagnosis not present

## 2013-04-07 DIAGNOSIS — Z13820 Encounter for screening for osteoporosis: Secondary | ICD-10-CM | POA: Diagnosis not present

## 2013-04-07 DIAGNOSIS — Z87891 Personal history of nicotine dependence: Secondary | ICD-10-CM | POA: Diagnosis not present

## 2013-04-07 DIAGNOSIS — Z8546 Personal history of malignant neoplasm of prostate: Secondary | ICD-10-CM | POA: Diagnosis not present

## 2013-04-07 DIAGNOSIS — M810 Age-related osteoporosis without current pathological fracture: Secondary | ICD-10-CM | POA: Diagnosis not present

## 2013-04-18 DIAGNOSIS — M549 Dorsalgia, unspecified: Secondary | ICD-10-CM | POA: Diagnosis not present

## 2013-04-27 DIAGNOSIS — M47817 Spondylosis without myelopathy or radiculopathy, lumbosacral region: Secondary | ICD-10-CM | POA: Diagnosis not present

## 2013-04-27 DIAGNOSIS — R209 Unspecified disturbances of skin sensation: Secondary | ICD-10-CM | POA: Diagnosis not present

## 2013-04-27 DIAGNOSIS — R29898 Other symptoms and signs involving the musculoskeletal system: Secondary | ICD-10-CM | POA: Diagnosis not present

## 2013-04-27 DIAGNOSIS — M25559 Pain in unspecified hip: Secondary | ICD-10-CM | POA: Diagnosis not present

## 2013-04-27 DIAGNOSIS — M5126 Other intervertebral disc displacement, lumbar region: Secondary | ICD-10-CM | POA: Diagnosis not present

## 2013-04-27 DIAGNOSIS — M5137 Other intervertebral disc degeneration, lumbosacral region: Secondary | ICD-10-CM | POA: Diagnosis not present

## 2013-05-02 ENCOUNTER — Other Ambulatory Visit: Payer: Self-pay | Admitting: Neurosurgery

## 2013-05-02 DIAGNOSIS — M545 Low back pain: Secondary | ICD-10-CM | POA: Diagnosis not present

## 2013-05-03 ENCOUNTER — Inpatient Hospital Stay (HOSPITAL_COMMUNITY): Payer: Medicare Other | Admitting: Certified Registered"

## 2013-05-03 ENCOUNTER — Encounter (HOSPITAL_COMMUNITY): Payer: Self-pay | Admitting: *Deleted

## 2013-05-03 ENCOUNTER — Inpatient Hospital Stay (HOSPITAL_COMMUNITY): Payer: Medicare Other

## 2013-05-03 ENCOUNTER — Encounter (HOSPITAL_COMMUNITY)
Admission: RE | Disposition: A | Discharge status: Home / Self Care | Payer: Self-pay | Source: Ambulatory Visit | Attending: Neurosurgery

## 2013-05-03 ENCOUNTER — Inpatient Hospital Stay (HOSPITAL_COMMUNITY)
Admission: RE | Admit: 2013-05-03 | Discharge: 2013-05-04 | DRG: 520 | Disposition: A | Discharge status: Home / Self Care | Payer: Medicare Other | Source: Ambulatory Visit | Attending: Neurosurgery | Admitting: Neurosurgery

## 2013-05-03 ENCOUNTER — Encounter (HOSPITAL_COMMUNITY): Payer: Medicare Other | Admitting: Certified Registered"

## 2013-05-03 DIAGNOSIS — I10 Essential (primary) hypertension: Secondary | ICD-10-CM | POA: Diagnosis not present

## 2013-05-03 DIAGNOSIS — Z7982 Long term (current) use of aspirin: Secondary | ICD-10-CM | POA: Diagnosis not present

## 2013-05-03 DIAGNOSIS — J4489 Other specified chronic obstructive pulmonary disease: Secondary | ICD-10-CM | POA: Diagnosis present

## 2013-05-03 DIAGNOSIS — I2510 Atherosclerotic heart disease of native coronary artery without angina pectoris: Secondary | ICD-10-CM | POA: Diagnosis present

## 2013-05-03 DIAGNOSIS — J449 Chronic obstructive pulmonary disease, unspecified: Secondary | ICD-10-CM | POA: Diagnosis not present

## 2013-05-03 DIAGNOSIS — E7800 Pure hypercholesterolemia, unspecified: Secondary | ICD-10-CM | POA: Diagnosis not present

## 2013-05-03 DIAGNOSIS — M79609 Pain in unspecified limb: Secondary | ICD-10-CM | POA: Diagnosis not present

## 2013-05-03 DIAGNOSIS — Z79899 Other long term (current) drug therapy: Secondary | ICD-10-CM

## 2013-05-03 DIAGNOSIS — M5126 Other intervertebral disc displacement, lumbar region: Secondary | ICD-10-CM | POA: Diagnosis not present

## 2013-05-03 DIAGNOSIS — Z961 Presence of intraocular lens: Secondary | ICD-10-CM

## 2013-05-03 DIAGNOSIS — Z01818 Encounter for other preprocedural examination: Secondary | ICD-10-CM | POA: Diagnosis not present

## 2013-05-03 DIAGNOSIS — M48061 Spinal stenosis, lumbar region without neurogenic claudication: Secondary | ICD-10-CM | POA: Diagnosis not present

## 2013-05-03 DIAGNOSIS — Z9861 Coronary angioplasty status: Secondary | ICD-10-CM | POA: Diagnosis not present

## 2013-05-03 DIAGNOSIS — M519 Unspecified thoracic, thoracolumbar and lumbosacral intervertebral disc disorder: Secondary | ICD-10-CM | POA: Diagnosis not present

## 2013-05-03 DIAGNOSIS — R0602 Shortness of breath: Secondary | ICD-10-CM | POA: Diagnosis not present

## 2013-05-03 DIAGNOSIS — Z9849 Cataract extraction status, unspecified eye: Secondary | ICD-10-CM

## 2013-05-03 DIAGNOSIS — M545 Low back pain, unspecified: Secondary | ICD-10-CM | POA: Diagnosis not present

## 2013-05-03 DIAGNOSIS — Z951 Presence of aortocoronary bypass graft: Secondary | ICD-10-CM | POA: Diagnosis not present

## 2013-05-03 HISTORY — DX: Atherosclerotic heart disease of native coronary artery without angina pectoris: I25.10

## 2013-05-03 HISTORY — DX: Essential (primary) hypertension: I10

## 2013-05-03 HISTORY — DX: Shortness of breath: R06.02

## 2013-05-03 HISTORY — PX: LUMBAR LAMINECTOMY/DECOMPRESSION MICRODISCECTOMY: SHX5026

## 2013-05-03 HISTORY — DX: Pure hypercholesterolemia, unspecified: E78.00

## 2013-05-03 HISTORY — DX: Gastro-esophageal reflux disease without esophagitis: K21.9

## 2013-05-03 HISTORY — DX: Malignant (primary) neoplasm, unspecified: C80.1

## 2013-05-03 LAB — BASIC METABOLIC PANEL
Calcium: 9.7 mg/dL (ref 8.4–10.5)
Chloride: 104 mEq/L (ref 96–112)
Creatinine, Ser: 1.07 mg/dL (ref 0.50–1.35)
GFR calc Af Amer: 74 mL/min — ABNORMAL LOW (ref >90)
Sodium: 140 mEq/L (ref 135–145)

## 2013-05-03 LAB — CBC
HCT: 42.8 % (ref 39.0–52.0)
Platelets: 266 10*3/uL (ref 150–400)
RBC: 4.9 MIL/uL (ref 4.22–5.81)
RDW: 13.2 % (ref 11.5–15.5)
WBC: 8.2 10*3/uL (ref 4.0–10.5)

## 2013-05-03 LAB — SURGICAL PCR SCREEN: Staphylococcus aureus: NEGATIVE

## 2013-05-03 SURGERY — LUMBAR LAMINECTOMY/DECOMPRESSION MICRODISCECTOMY 1 LEVEL
Anesthesia: General | Site: Spine Lumbar | Laterality: Right

## 2013-05-03 MED ORDER — FENTANYL CITRATE 0.05 MG/ML IJ SOLN
INTRAMUSCULAR | Status: DC | PRN
  Administered 2013-05-03: 50 ug via INTRAVENOUS

## 2013-05-03 MED ORDER — SODIUM CHLORIDE 0.9 % IJ SOLN: 3.0000 mL | INTRAMUSCULAR | Status: DC | PRN

## 2013-05-03 MED ORDER — PHENOL 1.4 % MT LIQD: 1.0000 | OROMUCOSAL | Status: DC | PRN

## 2013-05-03 MED ORDER — BUPIVACAINE LIPOSOME 1.3 % IJ SUSP
20.0000 mL | Freq: Once | INTRAMUSCULAR | Status: DC
  Filled 2013-05-03: qty 20

## 2013-05-03 MED ORDER — ATORVASTATIN CALCIUM 10 MG PO TABS
10.0000 mg | ORAL_TABLET | Freq: Every day | ORAL | Status: DC
  Administered 2013-05-03: 10 mg via ORAL
  Filled 2013-05-03 (×2): qty 1

## 2013-05-03 MED ORDER — MORPHINE SULFATE 10 MG/ML IJ SOLN
INTRAMUSCULAR | Status: DC | PRN
  Administered 2013-05-03 (×2): 5 mg via INTRAVENOUS

## 2013-05-03 MED ORDER — DEXAMETHASONE SODIUM PHOSPHATE 4 MG/ML IJ SOLN
INTRAMUSCULAR | Status: DC | PRN
  Administered 2013-05-03: 8 mg via INTRAVENOUS

## 2013-05-03 MED ORDER — LISINOPRIL 40 MG PO TABS
40.0000 mg | ORAL_TABLET | Freq: Every day | ORAL | Status: DC
  Administered 2013-05-03: 40 mg via ORAL
  Filled 2013-05-03 (×2): qty 1

## 2013-05-03 MED ORDER — CEFAZOLIN SODIUM-DEXTROSE 2-3 GM-% IV SOLR
INTRAVENOUS | Status: AC
  Administered 2013-05-03: 2 g via INTRAVENOUS
  Filled 2013-05-03: qty 50

## 2013-05-03 MED ORDER — SODIUM CHLORIDE 0.9 % IV SOLN
INTRAVENOUS | Status: DC
  Administered 2013-05-03: 18:00:00 via INTRAVENOUS

## 2013-05-03 MED ORDER — MENTHOL 3 MG MT LOZG: 1.0000 | LOZENGE | OROMUCOSAL | Status: DC | PRN

## 2013-05-03 MED ORDER — ROCURONIUM BROMIDE 100 MG/10ML IV SOLN
INTRAVENOUS | Status: DC | PRN
  Administered 2013-05-03: 50 mg via INTRAVENOUS

## 2013-05-03 MED ORDER — NAPHAZOLINE HCL 0.1 % OP SOLN
1.0000 [drp] | Freq: Four times a day (QID) | OPHTHALMIC | Status: DC | PRN
  Filled 2013-05-03: qty 15

## 2013-05-03 MED ORDER — MUPIROCIN 2 % EX OINT
TOPICAL_OINTMENT | Freq: Two times a day (BID) | CUTANEOUS | Status: DC
  Administered 2013-05-03 (×2): via NASAL
  Filled 2013-05-03 (×2): qty 22

## 2013-05-03 MED ORDER — DIAZEPAM 5 MG PO TABS: 5.0000 mg | ORAL_TABLET | Freq: Four times a day (QID) | ORAL | Status: DC | PRN

## 2013-05-03 MED ORDER — SODIUM CHLORIDE 0.9 % IV SOLN: 250.0000 mL | INTRAVENOUS | Status: DC

## 2013-05-03 MED ORDER — GLYCOPYRROLATE 0.2 MG/ML IJ SOLN
INTRAMUSCULAR | Status: DC | PRN
  Administered 2013-05-03: 0.2 mg via INTRAVENOUS
  Administered 2013-05-03: 0.4 mg via INTRAVENOUS

## 2013-05-03 MED ORDER — 0.9 % SODIUM CHLORIDE (POUR BTL) OPTIME
TOPICAL | Status: DC | PRN
  Administered 2013-05-03: 1000 mL

## 2013-05-03 MED ORDER — ONDANSETRON HCL 4 MG/2ML IJ SOLN
4.0000 mg | INTRAMUSCULAR | Status: DC | PRN
  Administered 2013-05-04: 4 mg via INTRAVENOUS
  Filled 2013-05-03: qty 2

## 2013-05-03 MED ORDER — PROPOFOL 10 MG/ML IV BOLUS
INTRAVENOUS | Status: DC | PRN
  Administered 2013-05-03: 10 mg via INTRAVENOUS
  Administered 2013-05-03: 20 mg via INTRAVENOUS
  Administered 2013-05-03: 40 mg via INTRAVENOUS
  Administered 2013-05-03: 200 mg via INTRAVENOUS

## 2013-05-03 MED ORDER — ZOLPIDEM TARTRATE 5 MG PO TABS: 5.0000 mg | ORAL_TABLET | Freq: Every evening | ORAL | Status: DC | PRN

## 2013-05-03 MED ORDER — LACTATED RINGERS IV SOLN
INTRAVENOUS | Status: DC
  Administered 2013-05-03: 10:00:00 via INTRAVENOUS

## 2013-05-03 MED ORDER — THEOPHYLLINE ER 200 MG PO TB12
200.0000 mg | ORAL_TABLET | Freq: Two times a day (BID) | ORAL | Status: DC
  Administered 2013-05-03: 200 mg via ORAL
  Filled 2013-05-03 (×3): qty 1

## 2013-05-03 MED ORDER — FENTANYL CITRATE 0.05 MG/ML IJ SOLN
INTRAMUSCULAR | Status: DC | PRN
  Administered 2013-05-03: 100 ug via INTRAVENOUS
  Administered 2013-05-03: 50 ug via INTRAVENOUS
  Administered 2013-05-03: 100 ug via INTRAVENOUS

## 2013-05-03 MED ORDER — FENTANYL CITRATE 0.05 MG/ML IJ SOLN
INTRAMUSCULAR | Status: AC
  Filled 2013-05-03: qty 2

## 2013-05-03 MED ORDER — LIDOCAINE HCL (CARDIAC) 20 MG/ML IV SOLN
INTRAVENOUS | Status: DC | PRN
  Administered 2013-05-03: 50 mg via INTRAVENOUS

## 2013-05-03 MED ORDER — AMLODIPINE BESYLATE 2.5 MG PO TABS
2.5000 mg | ORAL_TABLET | Freq: Every day | ORAL | Status: DC
  Administered 2013-05-03: 2.5 mg via ORAL
  Filled 2013-05-03 (×2): qty 1

## 2013-05-03 MED ORDER — NEOSTIGMINE METHYLSULFATE 1 MG/ML IJ SOLN
INTRAMUSCULAR | Status: DC | PRN
  Administered 2013-05-03: 3 mg via INTRAVENOUS

## 2013-05-03 MED ORDER — HEMOSTATIC AGENTS (NO CHARGE) OPTIME
TOPICAL | Status: DC | PRN
  Administered 2013-05-03: 1 via TOPICAL

## 2013-05-03 MED ORDER — ONDANSETRON HCL 4 MG/2ML IJ SOLN
INTRAMUSCULAR | Status: DC | PRN
  Administered 2013-05-03: 4 mg via INTRAVENOUS

## 2013-05-03 MED ORDER — THROMBIN 5000 UNITS EX SOLR
CUTANEOUS | Status: DC | PRN
  Administered 2013-05-03 (×2): 5000 [IU] via TOPICAL

## 2013-05-03 MED ORDER — EPHEDRINE SULFATE 50 MG/ML IJ SOLN
INTRAMUSCULAR | Status: DC | PRN
  Administered 2013-05-03 (×3): 10 mg via INTRAVENOUS
  Administered 2013-05-03: 15 mg via INTRAVENOUS
  Administered 2013-05-03: 10 mg via INTRAVENOUS
  Administered 2013-05-03: 5 mg via INTRAVENOUS

## 2013-05-03 MED ORDER — LACTATED RINGERS IV SOLN
INTRAVENOUS | Status: DC | PRN
  Administered 2013-05-03 (×2): via INTRAVENOUS

## 2013-05-03 MED ORDER — BUPIVACAINE LIPOSOME 1.3 % IJ SUSP
INTRAMUSCULAR | Status: DC | PRN
  Administered 2013-05-03: 20 mL

## 2013-05-03 MED ORDER — ONDANSETRON HCL 4 MG/2ML IJ SOLN: 4.0000 mg | Freq: Once | INTRAMUSCULAR | Status: DC | PRN

## 2013-05-03 MED ORDER — SODIUM CHLORIDE 0.9 % IJ SOLN: 3.0000 mL | Freq: Two times a day (BID) | INTRAMUSCULAR | Status: DC

## 2013-05-03 MED ORDER — ACETAMINOPHEN 325 MG PO TABS: 650.0000 mg | ORAL_TABLET | ORAL | Status: DC | PRN

## 2013-05-03 MED ORDER — CEFAZOLIN SODIUM 1-5 GM-% IV SOLN
1.0000 g | Freq: Three times a day (TID) | INTRAVENOUS | Status: AC
  Administered 2013-05-03 – 2013-05-04 (×2): 1 g via INTRAVENOUS
  Filled 2013-05-03 (×3): qty 50

## 2013-05-03 MED ORDER — PANTOPRAZOLE SODIUM 40 MG PO TBEC
40.0000 mg | DELAYED_RELEASE_TABLET | Freq: Every day | ORAL | Status: DC
  Administered 2013-05-03: 40 mg via ORAL
  Filled 2013-05-03: qty 1

## 2013-05-03 MED ORDER — MORPHINE SULFATE 2 MG/ML IJ SOLN
1.0000 mg | INTRAMUSCULAR | Status: DC | PRN
  Administered 2013-05-03: 2 mg via INTRAVENOUS
  Filled 2013-05-03: qty 1

## 2013-05-03 MED ORDER — OXYCODONE-ACETAMINOPHEN 5-325 MG PO TABS
1.0000 | ORAL_TABLET | ORAL | Status: DC | PRN
  Administered 2013-05-04 (×2): 2 via ORAL
  Filled 2013-05-03 (×2): qty 2

## 2013-05-03 MED ORDER — METHYLPREDNISOLONE ACETATE 80 MG/ML IJ SUSP
INTRAMUSCULAR | Status: DC | PRN
  Administered 2013-05-03: 80 mg

## 2013-05-03 MED ORDER — HYDROMORPHONE HCL PF 1 MG/ML IJ SOLN: 0.2500 mg | INTRAMUSCULAR | Status: DC | PRN

## 2013-05-03 MED ORDER — ACETAMINOPHEN 650 MG RE SUPP: 650.0000 mg | RECTAL | Status: DC | PRN

## 2013-05-03 MED ORDER — PHENYLEPHRINE HCL 10 MG/ML IJ SOLN
INTRAMUSCULAR | Status: DC | PRN
  Administered 2013-05-03: 80 ug via INTRAVENOUS
  Administered 2013-05-03: 40 ug via INTRAVENOUS
  Administered 2013-05-03 (×2): 80 ug via INTRAVENOUS

## 2013-05-03 MED ORDER — NAPHAZOLINE-GLYCERIN 0.012-0.2 % OP SOLN
1.0000 [drp] | Freq: Four times a day (QID) | OPHTHALMIC | Status: DC | PRN
  Administered 2013-05-03: 1 [drp] via OPHTHALMIC
  Filled 2013-05-03: qty 15

## 2013-05-03 SURGICAL SUPPLY — 54 items
BENZOIN TINCTURE PRP APPL 2/3 (GAUZE/BANDAGES/DRESSINGS) ×2 IMPLANT
BLADE SURG ROTATE 9660 (MISCELLANEOUS) ×2 IMPLANT
BUR ACORN 6.0 (BURR) ×2 IMPLANT
BUR MATCHSTICK NEURO 3.0 LAGG (BURR) ×2 IMPLANT
CANISTER SUCT 3000ML (MISCELLANEOUS) ×2 IMPLANT
CONT SPEC 4OZ CLIKSEAL STRL BL (MISCELLANEOUS) ×2 IMPLANT
DRAPE LAPAROTOMY 100X72X124 (DRAPES) ×2 IMPLANT
DRAPE MICROSCOPE LEICA (MISCELLANEOUS) ×2 IMPLANT
DRAPE POUCH INSTRU U-SHP 10X18 (DRAPES) ×2 IMPLANT
DURAPREP 26ML APPLICATOR (WOUND CARE) ×2 IMPLANT
ELECT REM PT RETURN 9FT ADLT (ELECTROSURGICAL) ×2
ELECTRODE REM PT RTRN 9FT ADLT (ELECTROSURGICAL) ×1 IMPLANT
GAUZE SPONGE 4X4 16PLY XRAY LF (GAUZE/BANDAGES/DRESSINGS) IMPLANT
GLOVE BIOGEL M 8.0 STRL (GLOVE) ×2 IMPLANT
GLOVE BIOGEL PI IND STRL 7.5 (GLOVE) ×3 IMPLANT
GLOVE BIOGEL PI IND STRL 8 (GLOVE) ×2 IMPLANT
GLOVE BIOGEL PI INDICATOR 7.5 (GLOVE) ×3
GLOVE BIOGEL PI INDICATOR 8 (GLOVE) ×2
GLOVE ECLIPSE 7.0 STRL STRAW (GLOVE) ×2 IMPLANT
GLOVE ECLIPSE 7.5 STRL STRAW (GLOVE) ×10 IMPLANT
GLOVE EXAM NITRILE LRG STRL (GLOVE) IMPLANT
GLOVE EXAM NITRILE MD LF STRL (GLOVE) IMPLANT
GLOVE EXAM NITRILE XL STR (GLOVE) IMPLANT
GLOVE EXAM NITRILE XS STR PU (GLOVE) IMPLANT
GOWN BRE IMP SLV AUR LG STRL (GOWN DISPOSABLE) ×4 IMPLANT
GOWN BRE IMP SLV AUR XL STRL (GOWN DISPOSABLE) ×2 IMPLANT
GOWN STRL REIN 2XL LVL4 (GOWN DISPOSABLE) ×2 IMPLANT
KIT BASIN OR (CUSTOM PROCEDURE TRAY) ×2 IMPLANT
KIT ROOM TURNOVER OR (KITS) ×2 IMPLANT
NEEDLE HYPO 18GX1.5 BLUNT FILL (NEEDLE) IMPLANT
NEEDLE HYPO 21X1.5 SAFETY (NEEDLE) ×2 IMPLANT
NEEDLE HYPO 25X1 1.5 SAFETY (NEEDLE) ×2 IMPLANT
NEEDLE SPNL 20GX3.5 QUINCKE YW (NEEDLE) ×2 IMPLANT
NS IRRIG 1000ML POUR BTL (IV SOLUTION) ×2 IMPLANT
PACK LAMINECTOMY NEURO (CUSTOM PROCEDURE TRAY) ×2 IMPLANT
PAD ABD 8X10 STRL (GAUZE/BANDAGES/DRESSINGS) IMPLANT
PAD ARMBOARD 7.5X6 YLW CONV (MISCELLANEOUS) ×10 IMPLANT
PATTIES SURGICAL .5 X1 (DISPOSABLE) IMPLANT
RUBBERBAND STERILE (MISCELLANEOUS) ×4 IMPLANT
SPONGE GAUZE 4X4 12PLY (GAUZE/BANDAGES/DRESSINGS) ×2 IMPLANT
SPONGE LAP 4X18 X RAY DECT (DISPOSABLE) IMPLANT
SPONGE SURGIFOAM ABS GEL SZ50 (HEMOSTASIS) ×2 IMPLANT
STRIP CLOSURE SKIN 1/2X4 (GAUZE/BANDAGES/DRESSINGS) ×2 IMPLANT
SUT VIC AB 0 CT1 18XCR BRD8 (SUTURE) ×1 IMPLANT
SUT VIC AB 0 CT1 8-18 (SUTURE) ×1
SUT VIC AB 2-0 CP2 18 (SUTURE) ×2 IMPLANT
SUT VIC AB 3-0 SH 8-18 (SUTURE) ×2 IMPLANT
SYR 20CC LL (SYRINGE) ×2 IMPLANT
SYR 20ML ECCENTRIC (SYRINGE) ×2 IMPLANT
SYR 5ML LL (SYRINGE) ×2 IMPLANT
TAPE CLOTH SURG 4X10 WHT LF (GAUZE/BANDAGES/DRESSINGS) ×2 IMPLANT
TOWEL OR 17X24 6PK STRL BLUE (TOWEL DISPOSABLE) ×2 IMPLANT
TOWEL OR 17X26 10 PK STRL BLUE (TOWEL DISPOSABLE) ×2 IMPLANT
WATER STERILE IRR 1000ML POUR (IV SOLUTION) ×2 IMPLANT

## 2013-05-03 NOTE — H&P (Signed)
Gabriel Mcdonald is an 79 y.o. male.   Chief Complaint: right leg pain HPI: patient with several weeks of lbp with radiation to the right leg, no better with conservative treatment, unable to sleep and lately weakness wtrh burning sensation  Past Medical History  Diagnosis Date  . COPD (chronic obstructive pulmonary disease)   . Hypertension   . High cholesterol   . Coronary artery disease     Past Surgical History  Procedure Laterality Date  . Coronary artery bypass graft  1991  . Coronary angioplasty  2011    stents  . Cataract extraction w/phaco  04/29/2012    Procedure: CATARACT EXTRACTION PHACO AND INTRAOCULAR LENS PLACEMENT (IOC);  Surgeon: Gemma Payor, MD;  Location: [facility];  Service: Ophthalmology;  Laterality: Right;  CDE:10.09  . Cataract extraction w/phaco  06/07/2012    Procedure: CATARACT EXTRACTION PHACO AND INTRAOCULAR LENS PLACEMENT (IOC);  Surgeon: Gemma Payor, MD;  Location: [facility];  Service: Ophthalmology;  Laterality: Left;  CDE:16.19    No family history on file. Social History:  reports that he has never smoked. He has never used smokeless tobacco. He reports that he does not drink alcohol or use illicit drugs.  Allergies: No Known Allergies  Medications Prior to Admission  Medication Sig Dispense Refill  . albuterol (PROVENTIL HFA;VENTOLIN HFA) 108 (90 BASE) MCG/ACT inhaler Inhale 2 puffs into the lungs every 6 (six) hours as needed. Shortness of Breath      . amLODipine (NORVASC) 5 MG tablet Take 2.5 mg by mouth daily.       Marland Kitchen aspirin 81 MG tablet Take 81 mg by mouth daily.      Marland Kitchen atorvastatin (LIPITOR) 10 MG tablet Take 10 mg by mouth daily.      . Cholecalciferol (VITAMIN D-3) 5000 UNITS TABS Take 5,000 Units by mouth daily.      . fish oil-omega-3 fatty acids 1000 MG capsule Take 1 g by mouth daily.       Marland Kitchen gabapentin (NEURONTIN) 300 MG capsule Take 300 mg by mouth 3 (three) times daily.      Marland Kitchen lisinopril (PRINIVIL,ZESTRIL) 40 MG tablet Take 40 mg by mouth  daily.      Marland Kitchen LORazepam (ATIVAN) 0.5 MG tablet Take 0.5 mg by mouth every 8 (eight) hours.      . Soft Lens Products (REFRESH CONTACTS DROPS) SOLN Place 1 drop into both eyes daily.      . theophylline (THEODUR) 200 MG 12 hr tablet Take 200 mg by mouth 2 (two) times daily.      Marland Kitchen tiZANidine (ZANAFLEX) 4 MG tablet Take 4 mg by mouth every 6 (six) hours as needed for muscle spasms.        No results found for this or any previous visit (from the past 48 hour(s)). No results found.  Review of Systems  Constitutional: Negative.   HENT: Negative.   Eyes: Negative.   Respiratory: Negative.   Cardiovascular: Negative.   Gastrointestinal: Negative.   Genitourinary: Negative.   Musculoskeletal: Positive for back pain.  Skin: Negative.   Neurological: Positive for sensory change and focal weakness.  Endo/Heme/Allergies: Negative.   Psychiatric/Behavioral: Negative.     Blood pressure 187/72, pulse 57, temperature 97.5 F (36.4 C), temperature source Oral, resp. rate 18, height 6' (1.829 m), weight 90.408 kg (199 lb 5 oz), SpO2 100.00%. Physical Exam patient who came yesterday to my office with his wife, limping from the right leg.hent, nl. Neck, nl. Cv, scar. Lungs,  some rales. Abdomen soft. Extremities grade 1 edema. NEURO weakness of DF right foot 2/5  . slr positive at 30 degrees, burning sensation affecting the l4-5 dermatomes. Mri shows a hnp at l4-5 , right with free fragment  Assessment/Plan Patient to go ahead with right 4-5 discectomy. He and his wife are aware of risks and benefits  Britania Shreeve M 05/03/2013, 10:41 AM

## 2013-05-03 NOTE — Transfer of Care (Signed)
Immediate Anesthesia Transfer of Care Note  Patient: Gabriel Mcdonald  Procedure(s) Performed: Procedure(s) with comments: RIGHT LUMBAR DISCECTOMY LUMBAR FOUR-FIVE (Right) - right   Patient Location: PACU  Anesthesia Type:General  Level of Consciousness: patient cooperative and responds to stimulation  Airway & Oxygen Therapy: Patient Spontanous Breathing and Patient connected to face mask oxygen  Post-op Assessment: Report given to PACU RN and Post -op Vital signs reviewed and stable  Post vital signs: Reviewed and stable  Complications: No apparent anesthesia complications

## 2013-05-03 NOTE — Progress Notes (Signed)
Patient ID: Gabriel Mcdonald, male   DOB: 09/16/1931, 79 y.o.   MRN: [id number] No leg pain

## 2013-05-03 NOTE — Anesthesia Preprocedure Evaluation (Signed)
Anesthesia Evaluation  Patient identified by MRN, date of birth, ID band Patient awake    Reviewed: Allergy & Precautions, H&P , NPO status , Patient's Chart, lab work & pertinent test results  Airway       Dental   Pulmonary COPD         Cardiovascular Exercise Tolerance: Good hypertension, + CAD, + Cardiac Stents and + CABG     Neuro/Psych    GI/Hepatic GERD-  ,  Endo/Other    Renal/GU      Musculoskeletal   Abdominal   Peds  Hematology   Anesthesia Other Findings   Reproductive/Obstetrics                           Anesthesia Physical Anesthesia Plan  ASA: III  Anesthesia Plan: General   Post-op Pain Management:    Induction: Intravenous  Airway Management Planned: Oral ETT  Additional Equipment:   Intra-op Plan:   Post-operative Plan: Extubation in OR  Informed Consent: I have reviewed the patients History and Physical, chart, labs and discussed the procedure including the risks, benefits and alternatives for the proposed anesthesia with the patient or authorized representative who has indicated his/her understanding and acceptance.     Plan Discussed with:   Anesthesia Plan Comments:         Anesthesia Quick Evaluation

## 2013-05-03 NOTE — Preoperative (Signed)
Beta Blockers   Reason not to administer Beta Blockers:Not Applicable 

## 2013-05-03 NOTE — Progress Notes (Signed)
Dr. Feliberto Gottron called and informed of EKG results including left bundle branch block that appeared to be new. No new orders received.

## 2013-05-03 NOTE — Anesthesia Postprocedure Evaluation (Signed)
  Anesthesia Post-op Note  Patient: Gabriel Mcdonald  Procedure(s) Performed: Procedure(s) with comments: RIGHT LUMBAR DISCECTOMY LUMBAR FOUR-FIVE (Right) - right   Patient Location: PACU  Anesthesia Type:General  Level of Consciousness: awake, alert , oriented and patient cooperative  Airway and Oxygen Therapy: Patient Spontanous Breathing  Post-op Pain: mild  Post-op Assessment: Post-op Vital signs reviewed, Patient's Cardiovascular Status Stable, Respiratory Function Stable, Patent Airway, No signs of Nausea or vomiting and Pain level controlled  Post-op Vital Signs: stable  Complications: No apparent anesthesia complications

## 2013-05-03 NOTE — Progress Notes (Signed)
Op note [phone]

## 2013-05-04 MED ORDER — PROCHLORPERAZINE EDISYLATE 5 MG/ML IJ SOLN
10.0000 mg | Freq: Once | INTRAMUSCULAR | Status: AC
  Administered 2013-05-04: 10 mg via INTRAVENOUS
  Filled 2013-05-04: qty 2

## 2013-05-04 NOTE — Progress Notes (Signed)
Pt. DC'd home via car with family.  DC instructions given and understood by patient.  Assessments were stable.

## 2013-05-04 NOTE — Evaluation (Signed)
Physical Therapy Evaluation Patient Details Name: Gabriel Mcdonald MRN: [id number] DOB: 09/27/1931 Today's Date: 05/04/2013 Time: 4098-1191 PT Time Calculation (min): 28 min  PT Assessment / Plan / Recommendation History of Present Illness  pt presents with long term R LE weakness and pain.  Now s/p L4-5 Discectomy.    Clinical Impression  Pt agreeable to mobility, but notes being nervous 2/2 long hx of pain and weakness in R LE.  Pt will have good support from family at D/C.      PT Assessment  Patient needs continued PT services    Follow Up Recommendations  Home health PT;Supervision - Intermittent    Does the patient have the potential to tolerate intense rehabilitation      Barriers to Discharge        Equipment Recommendations  Rolling walker with 5" wheels    Recommendations for Other Services     Frequency Min 5X/week    Precautions / Restrictions Precautions Precautions: Back Precaution Booklet Issued: Yes (comment) Restrictions Weight Bearing Restrictions: No   Pertinent Vitals/Pain Indicates pain around incision 5/10 during mobility.        Mobility  Bed Mobility Bed Mobility: Not assessed Transfers Transfers: Sit to Stand;Stand to Sit Sit to Stand: 4: Min guard;With upper extremity assist;From chair/3-in-1 Stand to Sit: 5: Supervision;With upper extremity assist;To chair/3-in-1 Details for Transfer Assistance: cues for UE use.   Ambulation/Gait Ambulation/Gait Assistance: 4: Min guard Ambulation Distance (Feet): 110 Feet Assistive device: Rolling walker Ambulation/Gait Assistance Details: cues for safe use of RW, upright posture, encouragement.   Gait Pattern: Step-through pattern;Decreased stride length;Trunk flexed Stairs: No Wheelchair Mobility Wheelchair Mobility: No    Exercises     PT Diagnosis: Difficulty walking  PT Problem List: Decreased strength;Decreased activity tolerance;Decreased balance;Decreased mobility;Decreased knowledge of  use of DME;Decreased knowledge of precautions PT Treatment Interventions: DME instruction;Gait training;Stair training;Functional mobility training;Therapeutic activities;Therapeutic exercise;Balance training;Patient/family education     PT Goals(Current goals can be found in the care plan section) Acute Rehab PT Goals Patient Stated Goal: Sleep in my own bed.   PT Goal Formulation: With patient Time For Goal Achievement: 05/11/13 Potential to Achieve Goals: Good  Visit Information  Last PT Received On: 05/04/13 Assistance Needed: +1 History of Present Illness: pt presents with long term R LE weakness and pain.  Now s/p L4-5 Discectomy.         Prior Functioning  Home Living Family/patient expects to be discharged to:: Private residence Living Arrangements: Spouse/significant other Available Help at Discharge: Family;Available 24 hours/day Type of Home: House Home Access: Stairs to enter [employer] of Steps: 1 Entrance Stairs-Rails: None Home Layout: One level Home Equipment: Walker - standard;Crutches;Wheelchair - manual Prior Function Level of Independence: Independent Communication Communication: No difficulties    Cognition  Cognition Arousal/Alertness: Awake/alert Behavior During Therapy: WFL for tasks assessed/performed Overall Cognitive Status: Within Functional Limits for tasks assessed    Extremity/Trunk Assessment Upper Extremity Assessment Upper Extremity Assessment: Defer to OT evaluation Lower Extremity Assessment Lower Extremity Assessment: RLE deficits/detail RLE Deficits / Details: R knee strength 4/5.   Cervical / Trunk Assessment Cervical / Trunk Assessment: Kyphotic   Balance Balance Balance Assessed: No  End of Session PT - End of Session Equipment Utilized During Treatment: Gait belt Activity Tolerance: Patient tolerated treatment well Patient left: in chair;with call bell/phone within reach;with family/visitor present Nurse  Communication: Mobility status;Patient requests pain meds  GP     Aristea Posada, Alison Murray, [place] [phone] 05/04/2013, 9:12 AM

## 2013-05-04 NOTE — Progress Notes (Signed)
UR complete.  Laporcha Marchesi RN, MSN 

## 2013-05-04 NOTE — Op Note (Signed)
NAME:  Gabriel Mcdonald, Gabriel Mcdonald                 ACCOUNT NO.:  [id number]  MEDICAL RECORD NO.:  [id number]  LOCATION:  [id number]                        FACILITY:  [facility]  PHYSICIAN:  Hilda Lias, M.D.   DATE OF BIRTH:  December 20, 1931  DATE OF PROCEDURE:  05/03/2013 DATE OF DISCHARGE:                              OPERATIVE REPORT   PREOPERATIVE DIAGNOSES:  Right L4-L5 herniated disk with fragment. Stenosis.  POSTOPERATIVE DIAGNOSES:  Right L4-L5 herniated disk with fragment. Stenosis.  PROCEDURE:  Right L4-5 laminotomy.  Decompression of the thecal sac, foraminotomy to decompress the L4 and L5 nerve root.  Removal of several fragment of disk compromising the axilla of the L4 nerve root. Microscope.  SURGEON:  Hilda Lias, M.D.  ASSISTANT:  Dr. Davene Costain.  CLINICAL HISTORY:  The patient was seen in my office yesterday afternoon with sudden onset of pain went to the right leg up to the point that he came limping and crying with pain.  Medications has not been of any help.  MRI showed that he has stenosis with the fragment compromising the L4-L5 area.  Surgery was advised and he and his wife knew the risk and benefit with the surgery.  PROCEDURE:  The patient was taken to the OR, and after intubation, he was positioned in a prone manner.  The back was cleaned first with Betadine and later on with DuraPrep.  Drapes were applied.  Midline incision from L4-5 was made and muscle was retracted laterally.  We brought the microscope into the area and with the drill, we removed the two-third of the lamina of L4 and one-third of the lamina of L5.  A thick calcified yellow ligament was also excised.  There was an area where the thecal sac was pushed by the ligament.  Removal of the ligament was done.  We found the L5 nerve root, foraminotomy was accomplished.  We found the L4 nerve root and also foraminotomy was done.  Then the disk was completely filled, there was no opening, but immediately right  underneath the axilla of the L4 nerve root, there were 4 fragment disk going into the foramen.  Removal was achieved.  We went above the nerve root, it was negative.  At the end, we had plenty of space for the thecal sac, the L4 and L5 nerve root.  Valsalva maneuver was negative.  Then, Depo-Medrol and fentanyl were left in the epidural space and the wound was closed with Vicryl and Steri-Strips.          ______________________________ Hilda Lias, M.D.     EB/MEDQ  D:  05/03/2013  T:  05/04/2013  Job:  [id number]

## 2013-05-04 NOTE — Discharge Summary (Signed)
Physician Discharge Summary  Patient ID: Gabriel Mcdonald MRN: [id number] DOB/AGE: 1932-05-22 79 y.o.  Admit date: 05/03/2013 Discharge date: 05/04/2013  Admission Diagnoses:right l4-5 hnp  Discharge Diagnoses: same Active Problems:   Lumbar herniated disc   Discharged Condition:nopain noweakness  Hospital Course: surgery  Consults: none  Significant Diagnostic Studies: mri  Treatments: right l4-5 discectomy  Discharge Exam: Blood pressure 121/43, pulse 66, temperature 98.1 F (36.7 C), temperature source Oral, resp. rate 18, height 6' (1.829 m), weight 93.441 kg (206 lb), SpO2 94.00%. Ambulating. No pain  Disposition: home. To see me in 4 weeks. ENCOURAGED TO GO OUT OF THE HOME AND AMBULATE. NO BENDING     Medication List    ASK your doctor about these medications       albuterol 108 (90 BASE) MCG/ACT inhaler  Commonly known as:  PROVENTIL HFA;VENTOLIN HFA  Inhale 2 puffs into the lungs every 6 (six) hours as needed. Shortness of Breath     amLODipine 5 MG tablet  Commonly known as:  NORVASC  Take 2.5 mg by mouth daily.     aspirin 81 MG tablet  Take 81 mg by mouth daily.     atorvastatin 10 MG tablet  Commonly known as:  LIPITOR  Take 10 mg by mouth daily.     fish oil-omega-3 fatty acids 1000 MG capsule  Take 1 g by mouth daily.     gabapentin 300 MG capsule  Commonly known as:  NEURONTIN  Take 300 mg by mouth 3 (three) times daily.     lisinopril 40 MG tablet  Commonly known as:  PRINIVIL,ZESTRIL  Take 40 mg by mouth daily.     LORazepam 0.5 MG tablet  Commonly known as:  ATIVAN  Take 0.5 mg by mouth every 8 (eight) hours.     omeprazole 20 MG capsule  Commonly known as:  PRILOSEC  Take 20 mg by mouth daily.     REFRESH CONTACTS DROPS Soln  Place 1 drop into both eyes daily.     theophylline 200 MG 12 hr tablet  Commonly known as:  THEODUR  Take 200 mg by mouth 2 (two) times daily.     tiZANidine 4 MG tablet  Commonly known as:  ZANAFLEX   Take 4 mg by mouth every 6 (six) hours as needed for muscle spasms.     Vitamin D-3 5000 UNITS Tabs  Take 5,000 Units by mouth daily.         Signed: Karn Cassis 05/04/2013, 11:26 AM

## 2013-05-05 ENCOUNTER — Encounter (HOSPITAL_COMMUNITY): Payer: Self-pay | Admitting: Neurosurgery

## 2013-06-14 DIAGNOSIS — I10 Essential (primary) hypertension: Secondary | ICD-10-CM | POA: Diagnosis not present

## 2013-06-15 DIAGNOSIS — E782 Mixed hyperlipidemia: Secondary | ICD-10-CM | POA: Diagnosis not present

## 2013-06-15 DIAGNOSIS — I10 Essential (primary) hypertension: Secondary | ICD-10-CM | POA: Diagnosis not present

## 2013-10-24 DIAGNOSIS — L570 Actinic keratosis: Secondary | ICD-10-CM | POA: Diagnosis not present

## 2013-10-24 DIAGNOSIS — I10 Essential (primary) hypertension: Secondary | ICD-10-CM | POA: Diagnosis not present

## 2013-12-23 DIAGNOSIS — L570 Actinic keratosis: Secondary | ICD-10-CM | POA: Diagnosis not present

## 2013-12-29 ENCOUNTER — Other Ambulatory Visit (HOSPITAL_COMMUNITY): Payer: Self-pay

## 2013-12-29 DIAGNOSIS — J441 Chronic obstructive pulmonary disease with (acute) exacerbation: Secondary | ICD-10-CM

## 2013-12-30 ENCOUNTER — Ambulatory Visit (HOSPITAL_COMMUNITY)
Admission: RE | Admit: 2013-12-30 | Discharge: 2013-12-30 | Disposition: A | Discharge status: Home / Self Care | Payer: Self-pay | Source: Ambulatory Visit | Attending: Internal Medicine | Admitting: Internal Medicine

## 2013-12-30 ENCOUNTER — Other Ambulatory Visit (HOSPITAL_COMMUNITY): Payer: Self-pay | Admitting: Internal Medicine

## 2013-12-30 ENCOUNTER — Ambulatory Visit (HOSPITAL_COMMUNITY)
Admission: RE | Admit: 2013-12-30 | Discharge: 2013-12-30 | Disposition: A | Discharge status: Home / Self Care | Payer: Medicare Other | Source: Ambulatory Visit | Attending: Internal Medicine | Admitting: Internal Medicine

## 2013-12-30 DIAGNOSIS — Z77090 Contact with and (suspected) exposure to asbestos: Secondary | ICD-10-CM

## 2013-12-30 DIAGNOSIS — J441 Chronic obstructive pulmonary disease with (acute) exacerbation: Secondary | ICD-10-CM | POA: Diagnosis not present

## 2013-12-30 DIAGNOSIS — R918 Other nonspecific abnormal finding of lung field: Secondary | ICD-10-CM | POA: Diagnosis not present

## 2013-12-30 DIAGNOSIS — Z951 Presence of aortocoronary bypass graft: Secondary | ICD-10-CM | POA: Insufficient documentation

## 2013-12-30 MED ORDER — ALBUTEROL SULFATE (2.5 MG/3ML) 0.083% IN NEBU
2.5000 mg | INHALATION_SOLUTION | Freq: Once | RESPIRATORY_TRACT | Status: AC
  Administered 2013-12-30: 2.5 mg via RESPIRATORY_TRACT

## 2014-01-02 LAB — PULMONARY FUNCTION TEST
DL/VA % pred: 105 %
DL/VA: 4.87 ml/min/mmHg/L
DLCO UNC % PRED: 67 %
DLCO cor % pred: 67 %
DLCO cor: 22.87 ml/min/mmHg
DLCO unc: 22.87 ml/min/mmHg
FEF 25-75 POST: 3.75 L/s
FEF 25-75 Pre: 1.84 L/sec
FEF2575-%Change-Post: 103 %
FEF2575-%PRED-POST: 181 %
FEF2575-%Pred-Pre: 89 %
FEV1-%Change-Post: 22 %
FEV1-%PRED-POST: 80 %
FEV1-%Pred-Pre: 66 %
FEV1-POST: 2.43 L
FEV1-PRE: 1.99 L
FEV1FVC-%Change-Post: 2 %
FEV1FVC-%PRED-PRE: 106 %
FEV6-%CHANGE-POST: 18 %
FEV6-%Pred-Post: 78 %
FEV6-%Pred-Pre: 65 %
FEV6-POST: 3.07 L
FEV6-Pre: 2.59 L
FEV6FVC-%Change-Post: 0 %
FEV6FVC-%Pred-Post: 106 %
FEV6FVC-%Pred-Pre: 106 %
FVC-%CHANGE-POST: 18 %
FVC-%PRED-POST: 73 %
FVC-%Pred-Pre: 61 %
FVC-PRE: 2.59 L
FVC-Post: 3.07 L
PRE FEV6/FVC RATIO: 100 %
Post FEV1/FVC ratio: 79 %
Post FEV6/FVC ratio: 100 %
Pre FEV1/FVC ratio: 77 %

## 2014-03-03 ENCOUNTER — Telehealth (HOSPITAL_COMMUNITY): Payer: Self-pay | Admitting: Internal Medicine

## 2014-03-03 NOTE — Telephone Encounter (Signed)
DAUGHTER, DONNA TAYLOR CALLED TO GET PATIENT ACCOUNTING NUMBER, MRN & ORDER NUMBERS OF PROCEDURES. SHE WILL CALL [facility] AND  TRY TO EXPEDITE PAYMENT OF THESE BILLS. PATIENT ACCOUNT NUMBER [phone].

## 2014-03-14 DIAGNOSIS — Z23 Encounter for immunization: Secondary | ICD-10-CM | POA: Diagnosis not present

## 2014-03-27 DIAGNOSIS — L570 Actinic keratosis: Secondary | ICD-10-CM | POA: Diagnosis not present

## 2014-03-27 DIAGNOSIS — Z79899 Other long term (current) drug therapy: Secondary | ICD-10-CM | POA: Diagnosis not present

## 2014-03-27 DIAGNOSIS — I10 Essential (primary) hypertension: Secondary | ICD-10-CM | POA: Diagnosis not present

## 2014-05-01 DIAGNOSIS — Z6827 Body mass index (BMI) 27.0-27.9, adult: Secondary | ICD-10-CM | POA: Diagnosis not present

## 2014-05-01 DIAGNOSIS — M5137 Other intervertebral disc degeneration, lumbosacral region: Secondary | ICD-10-CM | POA: Diagnosis not present

## 2014-05-02 ENCOUNTER — Other Ambulatory Visit: Payer: Self-pay | Admitting: Neurosurgery

## 2014-05-02 DIAGNOSIS — M5137 Other intervertebral disc degeneration, lumbosacral region: Secondary | ICD-10-CM

## 2014-05-03 ENCOUNTER — Ambulatory Visit
Admission: RE | Admit: 2014-05-03 | Discharge: 2014-05-03 | Disposition: A | Discharge status: Home / Self Care | Payer: Medicare Other | Source: Ambulatory Visit | Attending: Neurosurgery | Admitting: Neurosurgery

## 2014-05-03 DIAGNOSIS — M545 Low back pain: Secondary | ICD-10-CM | POA: Diagnosis not present

## 2014-05-03 DIAGNOSIS — M5137 Other intervertebral disc degeneration, lumbosacral region: Secondary | ICD-10-CM

## 2014-05-03 MED ORDER — IOHEXOL 180 MG/ML  SOLN
1.0000 mL | Freq: Once | INTRAMUSCULAR | Status: AC | PRN
  Administered 2014-05-03: 1 mL via EPIDURAL

## 2014-05-03 MED ORDER — METHYLPREDNISOLONE ACETATE 40 MG/ML INJ SUSP (RADIOLOG
120.0000 mg | Freq: Once | INTRAMUSCULAR | Status: AC
  Administered 2014-05-03: 120 mg via EPIDURAL

## 2014-05-03 NOTE — Discharge Instructions (Signed)

## 2014-10-06 DIAGNOSIS — M545 Low back pain: Secondary | ICD-10-CM | POA: Diagnosis not present

## 2014-10-06 DIAGNOSIS — I10 Essential (primary) hypertension: Secondary | ICD-10-CM | POA: Diagnosis not present

## 2014-10-06 DIAGNOSIS — M47816 Spondylosis without myelopathy or radiculopathy, lumbar region: Secondary | ICD-10-CM | POA: Diagnosis not present

## 2014-10-06 DIAGNOSIS — M549 Dorsalgia, unspecified: Secondary | ICD-10-CM | POA: Diagnosis not present

## 2014-10-06 DIAGNOSIS — Z79899 Other long term (current) drug therapy: Secondary | ICD-10-CM | POA: Diagnosis not present

## 2014-10-06 DIAGNOSIS — Z8546 Personal history of malignant neoplasm of prostate: Secondary | ICD-10-CM | POA: Diagnosis not present

## 2014-10-06 DIAGNOSIS — Z7982 Long term (current) use of aspirin: Secondary | ICD-10-CM | POA: Diagnosis not present

## 2014-10-06 DIAGNOSIS — Z87891 Personal history of nicotine dependence: Secondary | ICD-10-CM | POA: Diagnosis not present

## 2014-10-06 DIAGNOSIS — E785 Hyperlipidemia, unspecified: Secondary | ICD-10-CM | POA: Diagnosis not present

## 2014-10-06 DIAGNOSIS — K449 Diaphragmatic hernia without obstruction or gangrene: Secondary | ICD-10-CM | POA: Diagnosis not present

## 2014-10-06 DIAGNOSIS — Z955 Presence of coronary angioplasty implant and graft: Secondary | ICD-10-CM | POA: Diagnosis not present

## 2014-10-06 DIAGNOSIS — M6281 Muscle weakness (generalized): Secondary | ICD-10-CM | POA: Diagnosis not present

## 2014-10-06 DIAGNOSIS — R262 Difficulty in walking, not elsewhere classified: Secondary | ICD-10-CM | POA: Diagnosis not present

## 2014-10-06 DIAGNOSIS — Z8249 Family history of ischemic heart disease and other diseases of the circulatory system: Secondary | ICD-10-CM | POA: Diagnosis not present

## 2014-10-06 DIAGNOSIS — I2510 Atherosclerotic heart disease of native coronary artery without angina pectoris: Secondary | ICD-10-CM | POA: Diagnosis not present

## 2014-10-06 DIAGNOSIS — M4806 Spinal stenosis, lumbar region: Secondary | ICD-10-CM | POA: Diagnosis not present

## 2014-10-06 DIAGNOSIS — F411 Generalized anxiety disorder: Secondary | ICD-10-CM | POA: Diagnosis not present

## 2014-10-06 DIAGNOSIS — S299XXA Unspecified injury of thorax, initial encounter: Secondary | ICD-10-CM | POA: Diagnosis not present

## 2014-10-06 DIAGNOSIS — S3992XA Unspecified injury of lower back, initial encounter: Secondary | ICD-10-CM | POA: Diagnosis not present

## 2014-10-06 DIAGNOSIS — J449 Chronic obstructive pulmonary disease, unspecified: Secondary | ICD-10-CM | POA: Diagnosis not present

## 2014-10-06 DIAGNOSIS — K219 Gastro-esophageal reflux disease without esophagitis: Secondary | ICD-10-CM | POA: Diagnosis not present

## 2014-10-07 DIAGNOSIS — M545 Low back pain: Secondary | ICD-10-CM | POA: Diagnosis not present

## 2014-10-07 DIAGNOSIS — I2510 Atherosclerotic heart disease of native coronary artery without angina pectoris: Secondary | ICD-10-CM | POA: Diagnosis not present

## 2014-10-07 DIAGNOSIS — M6281 Muscle weakness (generalized): Secondary | ICD-10-CM | POA: Diagnosis not present

## 2014-10-07 DIAGNOSIS — Z8546 Personal history of malignant neoplasm of prostate: Secondary | ICD-10-CM | POA: Diagnosis not present

## 2014-10-07 DIAGNOSIS — J449 Chronic obstructive pulmonary disease, unspecified: Secondary | ICD-10-CM | POA: Diagnosis not present

## 2014-10-09 DIAGNOSIS — M5126 Other intervertebral disc displacement, lumbar region: Secondary | ICD-10-CM | POA: Diagnosis not present

## 2014-10-09 DIAGNOSIS — I10 Essential (primary) hypertension: Secondary | ICD-10-CM | POA: Diagnosis not present

## 2014-10-09 DIAGNOSIS — Z6828 Body mass index (BMI) 28.0-28.9, adult: Secondary | ICD-10-CM | POA: Diagnosis not present

## 2014-10-13 DIAGNOSIS — Z6827 Body mass index (BMI) 27.0-27.9, adult: Secondary | ICD-10-CM | POA: Diagnosis not present

## 2014-10-13 DIAGNOSIS — M5137 Other intervertebral disc degeneration, lumbosacral region: Secondary | ICD-10-CM | POA: Diagnosis not present

## 2014-10-13 DIAGNOSIS — I10 Essential (primary) hypertension: Secondary | ICD-10-CM | POA: Diagnosis not present

## 2014-10-13 DIAGNOSIS — M5126 Other intervertebral disc displacement, lumbar region: Secondary | ICD-10-CM | POA: Diagnosis not present

## 2014-10-16 DIAGNOSIS — N23 Unspecified renal colic: Secondary | ICD-10-CM | POA: Diagnosis not present

## 2014-10-17 DIAGNOSIS — N201 Calculus of ureter: Secondary | ICD-10-CM | POA: Diagnosis not present

## 2014-10-17 DIAGNOSIS — N200 Calculus of kidney: Secondary | ICD-10-CM | POA: Diagnosis not present

## 2014-10-18 DIAGNOSIS — N200 Calculus of kidney: Secondary | ICD-10-CM | POA: Diagnosis not present

## 2014-10-18 DIAGNOSIS — Z01818 Encounter for other preprocedural examination: Secondary | ICD-10-CM | POA: Diagnosis not present

## 2014-10-25 DIAGNOSIS — N202 Calculus of kidney with calculus of ureter: Secondary | ICD-10-CM | POA: Diagnosis not present

## 2014-10-25 DIAGNOSIS — I719 Aortic aneurysm of unspecified site, without rupture: Secondary | ICD-10-CM | POA: Diagnosis not present

## 2014-10-25 DIAGNOSIS — Z85828 Personal history of other malignant neoplasm of skin: Secondary | ICD-10-CM | POA: Diagnosis not present

## 2014-10-25 DIAGNOSIS — Z885 Allergy status to narcotic agent status: Secondary | ICD-10-CM | POA: Diagnosis not present

## 2014-10-25 DIAGNOSIS — Z87442 Personal history of urinary calculi: Secondary | ICD-10-CM | POA: Diagnosis not present

## 2014-10-25 DIAGNOSIS — N201 Calculus of ureter: Secondary | ICD-10-CM | POA: Diagnosis not present

## 2014-10-25 DIAGNOSIS — Z87891 Personal history of nicotine dependence: Secondary | ICD-10-CM | POA: Diagnosis not present

## 2014-10-25 DIAGNOSIS — N209 Urinary calculus, unspecified: Secondary | ICD-10-CM | POA: Diagnosis not present

## 2014-10-25 DIAGNOSIS — Z8546 Personal history of malignant neoplasm of prostate: Secondary | ICD-10-CM | POA: Diagnosis not present

## 2014-10-25 DIAGNOSIS — Z7982 Long term (current) use of aspirin: Secondary | ICD-10-CM | POA: Diagnosis not present

## 2014-10-25 DIAGNOSIS — I10 Essential (primary) hypertension: Secondary | ICD-10-CM | POA: Diagnosis not present

## 2014-10-25 DIAGNOSIS — N200 Calculus of kidney: Secondary | ICD-10-CM | POA: Diagnosis not present

## 2014-10-25 DIAGNOSIS — I2510 Atherosclerotic heart disease of native coronary artery without angina pectoris: Secondary | ICD-10-CM | POA: Diagnosis not present

## 2014-10-25 DIAGNOSIS — K219 Gastro-esophageal reflux disease without esophagitis: Secondary | ICD-10-CM | POA: Diagnosis not present

## 2014-10-25 DIAGNOSIS — Z951 Presence of aortocoronary bypass graft: Secondary | ICD-10-CM | POA: Diagnosis not present

## 2014-10-25 DIAGNOSIS — E780 Pure hypercholesterolemia: Secondary | ICD-10-CM | POA: Diagnosis not present

## 2014-10-25 DIAGNOSIS — Z79899 Other long term (current) drug therapy: Secondary | ICD-10-CM | POA: Diagnosis not present

## 2014-10-31 DIAGNOSIS — Z79899 Other long term (current) drug therapy: Secondary | ICD-10-CM | POA: Diagnosis not present

## 2014-10-31 DIAGNOSIS — Z8546 Personal history of malignant neoplasm of prostate: Secondary | ICD-10-CM | POA: Diagnosis not present

## 2014-10-31 DIAGNOSIS — Z951 Presence of aortocoronary bypass graft: Secondary | ICD-10-CM | POA: Diagnosis not present

## 2014-10-31 DIAGNOSIS — I10 Essential (primary) hypertension: Secondary | ICD-10-CM | POA: Diagnosis not present

## 2014-10-31 DIAGNOSIS — Z87442 Personal history of urinary calculi: Secondary | ICD-10-CM | POA: Diagnosis not present

## 2014-10-31 DIAGNOSIS — Z885 Allergy status to narcotic agent status: Secondary | ICD-10-CM | POA: Diagnosis not present

## 2014-10-31 DIAGNOSIS — Z466 Encounter for fitting and adjustment of urinary device: Secondary | ICD-10-CM | POA: Diagnosis not present

## 2014-10-31 DIAGNOSIS — Z87891 Personal history of nicotine dependence: Secondary | ICD-10-CM | POA: Diagnosis not present

## 2014-10-31 DIAGNOSIS — Z7982 Long term (current) use of aspirin: Secondary | ICD-10-CM | POA: Diagnosis not present

## 2014-10-31 DIAGNOSIS — Z85828 Personal history of other malignant neoplasm of skin: Secondary | ICD-10-CM | POA: Diagnosis not present

## 2014-10-31 DIAGNOSIS — N201 Calculus of ureter: Secondary | ICD-10-CM | POA: Diagnosis not present

## 2014-10-31 DIAGNOSIS — N200 Calculus of kidney: Secondary | ICD-10-CM | POA: Diagnosis not present

## 2014-10-31 DIAGNOSIS — J45909 Unspecified asthma, uncomplicated: Secondary | ICD-10-CM | POA: Diagnosis not present

## 2014-11-03 DIAGNOSIS — F419 Anxiety disorder, unspecified: Secondary | ICD-10-CM | POA: Diagnosis not present

## 2014-11-03 DIAGNOSIS — N1330 Unspecified hydronephrosis: Secondary | ICD-10-CM | POA: Diagnosis not present

## 2014-11-03 DIAGNOSIS — Z8249 Family history of ischemic heart disease and other diseases of the circulatory system: Secondary | ICD-10-CM | POA: Diagnosis not present

## 2014-11-03 DIAGNOSIS — K449 Diaphragmatic hernia without obstruction or gangrene: Secondary | ICD-10-CM | POA: Diagnosis not present

## 2014-11-03 DIAGNOSIS — N210 Calculus in bladder: Secondary | ICD-10-CM | POA: Diagnosis not present

## 2014-11-03 DIAGNOSIS — J449 Chronic obstructive pulmonary disease, unspecified: Secondary | ICD-10-CM | POA: Diagnosis not present

## 2014-11-03 DIAGNOSIS — N132 Hydronephrosis with renal and ureteral calculous obstruction: Secondary | ICD-10-CM | POA: Diagnosis not present

## 2014-11-03 DIAGNOSIS — R109 Unspecified abdominal pain: Secondary | ICD-10-CM | POA: Diagnosis not present

## 2014-11-03 DIAGNOSIS — Z955 Presence of coronary angioplasty implant and graft: Secondary | ICD-10-CM | POA: Diagnosis not present

## 2014-11-03 DIAGNOSIS — I10 Essential (primary) hypertension: Secondary | ICD-10-CM | POA: Diagnosis not present

## 2014-11-03 DIAGNOSIS — K219 Gastro-esophageal reflux disease without esophagitis: Secondary | ICD-10-CM | POA: Diagnosis present

## 2014-11-03 DIAGNOSIS — N12 Tubulo-interstitial nephritis, not specified as acute or chronic: Secondary | ICD-10-CM | POA: Diagnosis not present

## 2014-11-03 DIAGNOSIS — Z9889 Other specified postprocedural states: Secondary | ICD-10-CM | POA: Diagnosis not present

## 2014-11-03 DIAGNOSIS — E780 Pure hypercholesterolemia: Secondary | ICD-10-CM | POA: Diagnosis present

## 2014-11-03 DIAGNOSIS — N23 Unspecified renal colic: Secondary | ICD-10-CM | POA: Diagnosis not present

## 2014-11-03 DIAGNOSIS — Z87442 Personal history of urinary calculi: Secondary | ICD-10-CM | POA: Diagnosis not present

## 2014-11-03 DIAGNOSIS — Z951 Presence of aortocoronary bypass graft: Secondary | ICD-10-CM | POA: Diagnosis not present

## 2014-11-03 DIAGNOSIS — Z8546 Personal history of malignant neoplasm of prostate: Secondary | ICD-10-CM | POA: Diagnosis not present

## 2014-11-03 DIAGNOSIS — I2510 Atherosclerotic heart disease of native coronary artery without angina pectoris: Secondary | ICD-10-CM | POA: Diagnosis not present

## 2014-11-03 DIAGNOSIS — Z87891 Personal history of nicotine dependence: Secondary | ICD-10-CM | POA: Diagnosis not present

## 2014-11-03 DIAGNOSIS — N200 Calculus of kidney: Secondary | ICD-10-CM | POA: Diagnosis not present

## 2014-11-03 DIAGNOSIS — N179 Acute kidney failure, unspecified: Secondary | ICD-10-CM | POA: Diagnosis not present

## 2014-11-03 DIAGNOSIS — Z79899 Other long term (current) drug therapy: Secondary | ICD-10-CM | POA: Diagnosis not present

## 2014-11-03 DIAGNOSIS — Z79891 Long term (current) use of opiate analgesic: Secondary | ICD-10-CM | POA: Diagnosis not present

## 2014-11-03 DIAGNOSIS — Z466 Encounter for fitting and adjustment of urinary device: Secondary | ICD-10-CM | POA: Diagnosis not present

## 2014-11-03 DIAGNOSIS — E785 Hyperlipidemia, unspecified: Secondary | ICD-10-CM | POA: Diagnosis not present

## 2014-11-03 DIAGNOSIS — Z7982 Long term (current) use of aspirin: Secondary | ICD-10-CM | POA: Diagnosis not present

## 2014-11-03 DIAGNOSIS — Z886 Allergy status to analgesic agent status: Secondary | ICD-10-CM | POA: Diagnosis not present

## 2014-11-13 DIAGNOSIS — N209 Urinary calculus, unspecified: Secondary | ICD-10-CM | POA: Diagnosis not present

## 2014-11-14 DIAGNOSIS — R5383 Other fatigue: Secondary | ICD-10-CM | POA: Diagnosis not present

## 2015-01-18 DIAGNOSIS — X32XXXA Exposure to sunlight, initial encounter: Secondary | ICD-10-CM | POA: Diagnosis not present

## 2015-01-18 DIAGNOSIS — L570 Actinic keratosis: Secondary | ICD-10-CM | POA: Diagnosis not present

## 2015-02-06 DIAGNOSIS — N200 Calculus of kidney: Secondary | ICD-10-CM | POA: Diagnosis not present

## 2015-02-06 DIAGNOSIS — N201 Calculus of ureter: Secondary | ICD-10-CM | POA: Diagnosis not present

## 2015-02-06 DIAGNOSIS — M25552 Pain in left hip: Secondary | ICD-10-CM | POA: Diagnosis not present

## 2015-02-06 DIAGNOSIS — Z9889 Other specified postprocedural states: Secondary | ICD-10-CM | POA: Diagnosis not present

## 2015-02-07 DIAGNOSIS — N202 Calculus of kidney with calculus of ureter: Secondary | ICD-10-CM | POA: Diagnosis not present

## 2015-02-22 DIAGNOSIS — L820 Inflamed seborrheic keratosis: Secondary | ICD-10-CM | POA: Diagnosis not present

## 2015-02-22 DIAGNOSIS — L570 Actinic keratosis: Secondary | ICD-10-CM | POA: Diagnosis not present

## 2015-02-22 DIAGNOSIS — X32XXXD Exposure to sunlight, subsequent encounter: Secondary | ICD-10-CM | POA: Diagnosis not present

## 2015-03-15 DIAGNOSIS — Z23 Encounter for immunization: Secondary | ICD-10-CM | POA: Diagnosis not present

## 2015-05-07 ENCOUNTER — Other Ambulatory Visit: Payer: Self-pay | Admitting: *Deleted

## 2015-05-07 DIAGNOSIS — I714 Abdominal aortic aneurysm, without rupture, unspecified: Secondary | ICD-10-CM

## 2015-05-10 DIAGNOSIS — N200 Calculus of kidney: Secondary | ICD-10-CM | POA: Diagnosis not present

## 2015-05-10 DIAGNOSIS — I714 Abdominal aortic aneurysm, without rupture: Secondary | ICD-10-CM | POA: Diagnosis not present

## 2015-05-22 ENCOUNTER — Encounter: Payer: Self-pay | Admitting: Vascular Surgery

## 2015-05-28 DIAGNOSIS — Z1389 Encounter for screening for other disorder: Secondary | ICD-10-CM | POA: Diagnosis not present

## 2015-05-28 DIAGNOSIS — I10 Essential (primary) hypertension: Secondary | ICD-10-CM | POA: Diagnosis not present

## 2015-05-28 DIAGNOSIS — J441 Chronic obstructive pulmonary disease with (acute) exacerbation: Secondary | ICD-10-CM | POA: Diagnosis not present

## 2015-05-28 DIAGNOSIS — Z0000 Encounter for general adult medical examination without abnormal findings: Secondary | ICD-10-CM | POA: Diagnosis not present

## 2015-05-29 ENCOUNTER — Encounter: Payer: Self-pay | Admitting: Vascular Surgery

## 2015-05-29 ENCOUNTER — Ambulatory Visit (INDEPENDENT_AMBULATORY_CARE_PROVIDER_SITE_OTHER): Payer: Medicare Other | Admitting: Vascular Surgery

## 2015-05-29 ENCOUNTER — Ambulatory Visit (HOSPITAL_COMMUNITY)
Admission: RE | Admit: 2015-05-29 | Discharge: 2015-05-29 | Disposition: A | Discharge status: Home / Self Care | Payer: Medicare Other | Source: Ambulatory Visit | Attending: Vascular Surgery | Admitting: Vascular Surgery

## 2015-05-29 VITALS — BP 126/68 | HR 58 | Temp 97.8°F | Resp 16 | Ht 72.0 in | Wt 202.0 lb

## 2015-05-29 DIAGNOSIS — I714 Abdominal aortic aneurysm, without rupture, unspecified: Secondary | ICD-10-CM

## 2015-05-29 DIAGNOSIS — N200 Calculus of kidney: Secondary | ICD-10-CM

## 2015-05-29 DIAGNOSIS — E7800 Pure hypercholesterolemia, unspecified: Secondary | ICD-10-CM | POA: Diagnosis not present

## 2015-05-29 DIAGNOSIS — I10 Essential (primary) hypertension: Secondary | ICD-10-CM | POA: Insufficient documentation

## 2015-05-29 NOTE — Progress Notes (Signed)
Subjective:     Patient ID: Gabriel Mcdonald, male   DOB: [birth year], 82 y.o.   MRN: [id number]  HPI this 82 year old male was referred by Dr. Exie Parody for evaluation of abdominal aortic aneurysm. Patient had renal calculus on the left side in May of 2016 which required stenting of the left ureter. CT scan at that time revealed aneurysmal dilatation of the upper abdominal aorta to 3.3 cm. Patient has no history of abdominal aortic aeurysm. He was referred for further evaluation. His renal calculus was treated effectively.   Past Medical History  Diagnosis Date  . Hypertension   . High cholesterol   . Coronary artery disease   . Shortness of breath   . Cancer [facility])     prostate  . GERD (gastroesophageal reflux disease)   . COPD (chronic obstructive pulmonary disease) (HCC)     Pleural thickening per pt, pt reports exposure to asbestos    Social History  Substance Use Topics  . Smoking status: Never Smoker   . Smokeless tobacco: Never Used  . Alcohol Use: No    History reviewed. No pertinent family history.  No Known Allergies   Current outpatient prescriptions:  .  albuterol (PROVENTIL HFA;VENTOLIN HFA) 108 (90 BASE) MCG/ACT inhaler, Inhale 2 puffs into the lungs every 6 (six) hours as needed. Shortness of Breath, Disp: , Rfl:  .  amLODipine (NORVASC) 5 MG tablet, Take 2.5 mg by mouth daily. , Disp: , Rfl:  .  aspirin 81 MG tablet, Take 81 mg by mouth daily., Disp: , Rfl:  .  atorvastatin (LIPITOR) 10 MG tablet, Take 10 mg by mouth daily., Disp: , Rfl:  .  Cholecalciferol (VITAMIN D-3) 5000 UNITS TABS, Take 5,000 Units by mouth daily., Disp: , Rfl:  .  fish oil-omega-3 fatty acids 1000 MG capsule, Take 1 g by mouth daily. , Disp: , Rfl:  .  gabapentin (NEURONTIN) 300 MG capsule, Take 300 mg by mouth 3 (three) times daily., Disp: , Rfl:  .  lisinopril (PRINIVIL,ZESTRIL) 40 MG tablet, Take 40 mg by mouth daily., Disp: , Rfl:  .  LORazepam (ATIVAN) 0.5 MG tablet, Take 0.5 mg by mouth  every 8 (eight) hours., Disp: , Rfl:  .  omeprazole (PRILOSEC) 20 MG capsule, Take 20 mg by mouth daily., Disp: , Rfl:  .  Soft Lens Products (REFRESH CONTACTS DROPS) SOLN, Place 1 drop into both eyes daily., Disp: , Rfl:  .  theophylline (THEODUR) 200 MG 12 hr tablet, Take 200 mg by mouth 2 (two) times daily., Disp: , Rfl:  .  tiZANidine (ZANAFLEX) 4 MG tablet, Take 4 mg by mouth every 6 (six) hours as needed for muscle spasms., Disp: , Rfl:   Filed Vitals:   05/29/15 0931  BP: 126/68  Pulse: 58  Temp: 97.8 F (36.6 C)  Resp: 16  Height: 6' (1.829 m)  Weight: 202 lb (91.627 kg)  SpO2: 98%    Body mass index is 27.39 kg/(m^2).           Review of Systems  Patient has history coronary artery bypass grafting in 1991 at [facility] with PTCA and stenting since that time at [facility]. Patient also has history of lumbar spine surgery several years ago. Denies any active neurologic symptoms or claudication symptoms. Other systems negative and a complete review of systems other than history of present illness     Objective:   Physical Exam BP 126/68 mmHg  Pulse 58  Temp(Src) 97.8 F (36.6  C)  Resp 16  Ht 6' (1.829 m)  Wt 202 lb (91.627 kg)  BMI 27.39 kg/m2  SpO2 98%  Gen.-alert and oriented x3 in no apparent distress HEENT normal for age Lungs no rhonchi or wheezing Cardiovascular regular rhythm no murmurs carotid pulses 3+ palpable no bruits audible Abdomen soft nontender no palpable masses Musculoskeletal free of  major deformities Skin clear -no rashes Neurologic normal Lower extremities 3+ femoral and dorsalis pedis pulses palpable bilaterally with no edema   today I reviewed the  CT scan of the abdomen and pelvis performed at [facility]. There is no abdominal aortic aneurysm noted. There is minimal slight dilatation of the aorta in the upper abdomen 2 possibly 32 mm but no focal aneurysm is noted. The infrarenal aorta is unremarkable.   today I  ordered a duplex scan of the abdominal aorta to rule out an aortic aneurysm. This revealed maximal dilatation of the aorta at 3.1 cm in the upper abdomen.       Assessment:      patient referred for possible aortic aneurysm. -no aortic aneurysm noted on CT scan.  This was confirmed on duplex scan of the aorta performed today in my office   coronary artery disease-coronary artery bypass grafting in 1991 at [facility]  History of lumbar spine disease previous lumbar laminectomy  Recent left renal calculus treated successfully with ureteral stenting    Plan:      no need for further duplex scans or CT scans to follow abdominal aorta as there is no evidence of concern regarding aortic aneurysm Return to see me on when necessary basis

## 2015-05-30 ENCOUNTER — Encounter: Payer: Self-pay | Admitting: Emergency Medicine

## 2015-06-14 DIAGNOSIS — Z79899 Other long term (current) drug therapy: Secondary | ICD-10-CM | POA: Diagnosis not present

## 2015-06-14 DIAGNOSIS — Z13820 Encounter for screening for osteoporosis: Secondary | ICD-10-CM | POA: Diagnosis not present

## 2015-06-14 DIAGNOSIS — M549 Dorsalgia, unspecified: Secondary | ICD-10-CM | POA: Diagnosis not present

## 2015-06-14 DIAGNOSIS — Z87891 Personal history of nicotine dependence: Secondary | ICD-10-CM | POA: Diagnosis not present

## 2015-06-14 DIAGNOSIS — E7800 Pure hypercholesterolemia, unspecified: Secondary | ICD-10-CM | POA: Diagnosis not present

## 2015-06-14 DIAGNOSIS — C61 Malignant neoplasm of prostate: Secondary | ICD-10-CM | POA: Diagnosis not present

## 2015-06-14 DIAGNOSIS — I10 Essential (primary) hypertension: Secondary | ICD-10-CM | POA: Diagnosis not present

## 2015-06-14 DIAGNOSIS — M810 Age-related osteoporosis without current pathological fracture: Secondary | ICD-10-CM | POA: Diagnosis not present

## 2015-08-21 DIAGNOSIS — I700 Atherosclerosis of aorta: Secondary | ICD-10-CM | POA: Diagnosis not present

## 2015-08-21 DIAGNOSIS — N200 Calculus of kidney: Secondary | ICD-10-CM | POA: Diagnosis not present

## 2015-08-21 DIAGNOSIS — R109 Unspecified abdominal pain: Secondary | ICD-10-CM | POA: Diagnosis not present

## 2015-08-23 DIAGNOSIS — X32XXXD Exposure to sunlight, subsequent encounter: Secondary | ICD-10-CM | POA: Diagnosis not present

## 2015-08-23 DIAGNOSIS — L570 Actinic keratosis: Secondary | ICD-10-CM | POA: Diagnosis not present

## 2015-09-04 DIAGNOSIS — J440 Chronic obstructive pulmonary disease with acute lower respiratory infection: Secondary | ICD-10-CM | POA: Diagnosis not present

## 2015-09-04 DIAGNOSIS — M545 Low back pain: Secondary | ICD-10-CM | POA: Diagnosis not present

## 2015-09-04 DIAGNOSIS — I10 Essential (primary) hypertension: Secondary | ICD-10-CM | POA: Diagnosis not present

## 2015-09-13 DIAGNOSIS — L570 Actinic keratosis: Secondary | ICD-10-CM | POA: Diagnosis not present

## 2015-09-13 DIAGNOSIS — X32XXXD Exposure to sunlight, subsequent encounter: Secondary | ICD-10-CM | POA: Diagnosis not present

## 2015-12-06 DIAGNOSIS — I10 Essential (primary) hypertension: Secondary | ICD-10-CM | POA: Diagnosis not present

## 2015-12-06 DIAGNOSIS — M545 Low back pain: Secondary | ICD-10-CM | POA: Diagnosis not present

## 2015-12-06 DIAGNOSIS — L570 Actinic keratosis: Secondary | ICD-10-CM | POA: Diagnosis not present

## 2015-12-06 DIAGNOSIS — J440 Chronic obstructive pulmonary disease with acute lower respiratory infection: Secondary | ICD-10-CM | POA: Diagnosis not present

## 2016-02-19 DIAGNOSIS — Z23 Encounter for immunization: Secondary | ICD-10-CM | POA: Diagnosis not present

## 2016-03-10 DIAGNOSIS — J440 Chronic obstructive pulmonary disease with acute lower respiratory infection: Secondary | ICD-10-CM | POA: Diagnosis not present

## 2016-03-10 DIAGNOSIS — M545 Low back pain: Secondary | ICD-10-CM | POA: Diagnosis not present

## 2016-03-10 DIAGNOSIS — Z79899 Other long term (current) drug therapy: Secondary | ICD-10-CM | POA: Diagnosis not present

## 2016-03-10 DIAGNOSIS — I10 Essential (primary) hypertension: Secondary | ICD-10-CM | POA: Diagnosis not present

## 2016-03-10 DIAGNOSIS — G5601 Carpal tunnel syndrome, right upper limb: Secondary | ICD-10-CM | POA: Diagnosis not present

## 2016-06-02 DIAGNOSIS — N200 Calculus of kidney: Secondary | ICD-10-CM | POA: Diagnosis not present

## 2016-06-02 DIAGNOSIS — M545 Low back pain: Secondary | ICD-10-CM | POA: Diagnosis not present

## 2016-07-10 DIAGNOSIS — X32XXXD Exposure to sunlight, subsequent encounter: Secondary | ICD-10-CM | POA: Diagnosis not present

## 2016-07-10 DIAGNOSIS — L570 Actinic keratosis: Secondary | ICD-10-CM | POA: Diagnosis not present

## 2016-07-31 DIAGNOSIS — L570 Actinic keratosis: Secondary | ICD-10-CM | POA: Diagnosis not present

## 2016-07-31 DIAGNOSIS — X32XXXD Exposure to sunlight, subsequent encounter: Secondary | ICD-10-CM | POA: Diagnosis not present

## 2016-08-26 DIAGNOSIS — J440 Chronic obstructive pulmonary disease with acute lower respiratory infection: Secondary | ICD-10-CM | POA: Diagnosis not present

## 2016-08-26 DIAGNOSIS — Z1389 Encounter for screening for other disorder: Secondary | ICD-10-CM | POA: Diagnosis not present

## 2016-08-26 DIAGNOSIS — I10 Essential (primary) hypertension: Secondary | ICD-10-CM | POA: Diagnosis not present

## 2016-08-26 DIAGNOSIS — G5601 Carpal tunnel syndrome, right upper limb: Secondary | ICD-10-CM | POA: Diagnosis not present

## 2016-08-26 DIAGNOSIS — M545 Low back pain: Secondary | ICD-10-CM | POA: Diagnosis not present

## 2016-08-26 DIAGNOSIS — Z0000 Encounter for general adult medical examination without abnormal findings: Secondary | ICD-10-CM | POA: Diagnosis not present

## 2016-12-04 DIAGNOSIS — L570 Actinic keratosis: Secondary | ICD-10-CM | POA: Diagnosis not present

## 2016-12-04 DIAGNOSIS — D225 Melanocytic nevi of trunk: Secondary | ICD-10-CM | POA: Diagnosis not present

## 2016-12-04 DIAGNOSIS — X32XXXD Exposure to sunlight, subsequent encounter: Secondary | ICD-10-CM | POA: Diagnosis not present

## 2016-12-04 DIAGNOSIS — Z1283 Encounter for screening for malignant neoplasm of skin: Secondary | ICD-10-CM | POA: Diagnosis not present

## 2016-12-11 DIAGNOSIS — I10 Essential (primary) hypertension: Secondary | ICD-10-CM | POA: Diagnosis not present

## 2016-12-11 DIAGNOSIS — Z6829 Body mass index (BMI) 29.0-29.9, adult: Secondary | ICD-10-CM | POA: Diagnosis not present

## 2016-12-11 DIAGNOSIS — M5416 Radiculopathy, lumbar region: Secondary | ICD-10-CM | POA: Diagnosis not present

## 2016-12-16 DIAGNOSIS — M5416 Radiculopathy, lumbar region: Secondary | ICD-10-CM | POA: Diagnosis not present

## 2016-12-17 DIAGNOSIS — Z6828 Body mass index (BMI) 28.0-28.9, adult: Secondary | ICD-10-CM | POA: Diagnosis not present

## 2016-12-17 DIAGNOSIS — M5416 Radiculopathy, lumbar region: Secondary | ICD-10-CM | POA: Diagnosis not present

## 2016-12-17 DIAGNOSIS — M48061 Spinal stenosis, lumbar region without neurogenic claudication: Secondary | ICD-10-CM | POA: Diagnosis not present

## 2016-12-17 DIAGNOSIS — M48062 Spinal stenosis, lumbar region with neurogenic claudication: Secondary | ICD-10-CM | POA: Diagnosis not present

## 2016-12-17 DIAGNOSIS — I10 Essential (primary) hypertension: Secondary | ICD-10-CM | POA: Diagnosis not present

## 2017-01-07 DIAGNOSIS — I10 Essential (primary) hypertension: Secondary | ICD-10-CM | POA: Diagnosis not present

## 2017-01-13 DIAGNOSIS — M48062 Spinal stenosis, lumbar region with neurogenic claudication: Secondary | ICD-10-CM | POA: Diagnosis not present

## 2017-01-13 DIAGNOSIS — M5416 Radiculopathy, lumbar region: Secondary | ICD-10-CM | POA: Diagnosis not present

## 2017-01-13 DIAGNOSIS — I10 Essential (primary) hypertension: Secondary | ICD-10-CM | POA: Diagnosis not present

## 2017-01-14 DIAGNOSIS — M79641 Pain in right hand: Secondary | ICD-10-CM | POA: Diagnosis not present

## 2017-01-14 DIAGNOSIS — G5602 Carpal tunnel syndrome, left upper limb: Secondary | ICD-10-CM | POA: Diagnosis not present

## 2017-01-14 DIAGNOSIS — G5603 Carpal tunnel syndrome, bilateral upper limbs: Secondary | ICD-10-CM | POA: Diagnosis not present

## 2017-01-14 DIAGNOSIS — M79642 Pain in left hand: Secondary | ICD-10-CM | POA: Diagnosis not present

## 2017-01-14 DIAGNOSIS — G5601 Carpal tunnel syndrome, right upper limb: Secondary | ICD-10-CM | POA: Diagnosis not present

## 2017-01-16 DIAGNOSIS — J440 Chronic obstructive pulmonary disease with acute lower respiratory infection: Secondary | ICD-10-CM | POA: Diagnosis not present

## 2017-01-16 DIAGNOSIS — I10 Essential (primary) hypertension: Secondary | ICD-10-CM | POA: Diagnosis not present

## 2017-01-16 DIAGNOSIS — M545 Low back pain: Secondary | ICD-10-CM | POA: Diagnosis not present

## 2017-01-16 DIAGNOSIS — K5909 Other constipation: Secondary | ICD-10-CM | POA: Diagnosis not present

## 2017-01-24 DIAGNOSIS — M79641 Pain in right hand: Secondary | ICD-10-CM | POA: Diagnosis not present

## 2017-02-03 ENCOUNTER — Encounter: Payer: Self-pay | Admitting: Nurse Practitioner

## 2017-02-04 DIAGNOSIS — M5416 Radiculopathy, lumbar region: Secondary | ICD-10-CM | POA: Diagnosis not present

## 2017-02-04 DIAGNOSIS — M48062 Spinal stenosis, lumbar region with neurogenic claudication: Secondary | ICD-10-CM | POA: Diagnosis not present

## 2017-02-11 DIAGNOSIS — M79642 Pain in left hand: Secondary | ICD-10-CM | POA: Diagnosis not present

## 2017-02-11 DIAGNOSIS — G5603 Carpal tunnel syndrome, bilateral upper limbs: Secondary | ICD-10-CM | POA: Diagnosis not present

## 2017-02-11 DIAGNOSIS — M79641 Pain in right hand: Secondary | ICD-10-CM | POA: Diagnosis not present

## 2017-02-16 ENCOUNTER — Ambulatory Visit (INDEPENDENT_AMBULATORY_CARE_PROVIDER_SITE_OTHER): Payer: Medicare Other | Admitting: Nurse Practitioner

## 2017-02-16 ENCOUNTER — Encounter (INDEPENDENT_AMBULATORY_CARE_PROVIDER_SITE_OTHER): Payer: Self-pay

## 2017-02-16 ENCOUNTER — Other Ambulatory Visit: Payer: Self-pay

## 2017-02-16 ENCOUNTER — Encounter: Payer: Self-pay | Admitting: Nurse Practitioner

## 2017-02-16 VITALS — BP 98/54 | HR 62 | Ht 72.0 in | Wt 198.0 lb

## 2017-02-16 DIAGNOSIS — K649 Unspecified hemorrhoids: Secondary | ICD-10-CM | POA: Diagnosis not present

## 2017-02-16 DIAGNOSIS — K219 Gastro-esophageal reflux disease without esophagitis: Secondary | ICD-10-CM | POA: Diagnosis not present

## 2017-02-16 DIAGNOSIS — K5900 Constipation, unspecified: Secondary | ICD-10-CM | POA: Diagnosis not present

## 2017-02-16 MED ORDER — NA SULFATE-K SULFATE-MG SULF 17.5-3.13-1.6 GM/177ML PO SOLN: ORAL | 0 refills | Status: DC

## 2017-02-16 NOTE — Progress Notes (Signed)
HPI: Patient is an 83 year old male here with his friend / caregiver for evaluation of constipation. He has a one-year history of constipation which has gotten progressively worse over the last few months. He has had to manually disimpact himself a few times. Patient recently started clearlax and his bowel movements have significantly improved. No rectal bleeding. He complains of external hemorrhoid swelling and wants a refill on anamantle. No rectal bleeding. He has had some mild weight loss. Patient recalls having a colonoscopy at the [facility] in [place] around 2011. He said that polyps were removed at the time of colonoscopy but he does not know the nature of the polyps. He has neglected his own medical issues due to caring for his wife who passed away in 21-Dec-2022,   Gabriel Mcdonald also has long-standing GERD which he describes as indigestion. He took Naprosyn and Advil for months but this was discontinued in July because of his complaints of worsening indigestion. Since then his upper abdominal symptoms have improved with the exception of burping but that is getting better as well.No chest pain. No SOB or dizziness.    Past Medical History:  Diagnosis Date  . Cancer [facility])    prostate  . COPD (chronic obstructive pulmonary disease) (HCC)    Pleural thickening per pt, pt reports exposure to asbestos  . Coronary artery disease   . GERD (gastroesophageal reflux disease)   . High cholesterol   . Hypertension     Past Surgical History:  Procedure Laterality Date  . CATARACT EXTRACTION W/PHACO  04/29/2012   Procedure: CATARACT EXTRACTION PHACO AND INTRAOCULAR LENS PLACEMENT (IOC);  Surgeon: Tonny Branch, MD;  Location: [facility];  Service: Ophthalmology;  Laterality: Right;  CDE:10.09  . CATARACT EXTRACTION W/PHACO  06/07/2012   Procedure: CATARACT EXTRACTION PHACO AND INTRAOCULAR LENS PLACEMENT (IOC);  Surgeon: Tonny Branch, MD;  Location: [facility];  Service: Ophthalmology;  Laterality: Left;  CDE:16.19   . CORONARY ANGIOPLASTY  2011   stents  . [facility]  . LUMBAR LAMINECTOMY/DECOMPRESSION MICRODISCECTOMY Right 05/03/2013   Procedure: RIGHT LUMBAR DISCECTOMY LUMBAR FOUR-FIVE;  Surgeon: Floyce Stakes, MD;  Location: [facility];  Service: Neurosurgery;  Laterality: Right;  right   . Pylonodial cyst removal     from tip of spine  . TONSILLECTOMY     History reviewed. No pertinent family history. Social History  Substance Use Topics  . Smoking status: Never Smoker  . Smokeless tobacco: Never Used  . Alcohol use No   Current Outpatient Prescriptions  Medication Sig Dispense Refill  . albuterol (PROVENTIL HFA;VENTOLIN HFA) 108 (90 BASE) MCG/ACT inhaler Inhale 2 puffs into the lungs every 6 (six) hours as needed. Shortness of Breath    . amLODipine (NORVASC) 5 MG tablet Take 2.5 mg by mouth daily.     Marland Kitchen aspirin 81 MG tablet Take 81 mg by mouth daily.    Marland Kitchen atorvastatin (LIPITOR) 10 MG tablet Take 10 mg by mouth daily.    . Cholecalciferol (VITAMIN D-3) 5000 UNITS TABS Take 5,000 Units by mouth daily.    . fish oil-omega-3 fatty acids 1000 MG capsule Take 1 g by mouth daily.     Marland Kitchen lisinopril (PRINIVIL,ZESTRIL) 40 MG tablet Take 40 mg by mouth daily.    Marland Kitchen LORazepam (ATIVAN) 0.5 MG tablet Take 0.5 mg by mouth as needed.     Marland Kitchen omeprazole (PRILOSEC) 20 MG capsule Take 20 mg by mouth  daily.    . Soft Lens Products (REFRESH CONTACTS DROPS) SOLN Place 1 drop into both eyes daily.    . theophylline (THEODUR) 200 MG 12 hr tablet Take 400 mg by mouth every morning.     Marland Kitchen tiZANidine (ZANAFLEX) 4 MG tablet Take 4 mg by mouth every 6 (six) hours as needed for muscle spasms.     No current facility-administered medications for this visit.    No Known Allergies   Review of Systems: All systems reviewed and negative except where noted in HPI.    Physical Exam: BP (!) 98/54   Pulse 62   Ht 6' (1.829 m)   Wt 198 lb (89.8 kg)   BMI 26.85 kg/m    Constitutional:  Well-developed, white male in no acute distress. Psychiatric: Normal mood and affect. Behavior is normal. EENT: Pupils normal.  Conjunctivae are normal. No scleral icterus. Neck supple.  Cardiovascular: Normal rate, regular rhythm. No edema Pulmonary/chest: Effort normal and breath sounds normal. No wheezing, rales or rhonchi. Abdominal: Soft, nondistended. Nontender. Bowel sounds active throughout. There are no masses palpable. No hepatomegaly. Rectal: no external hemorrhoids Lymphadenopathy: No cervical adenopathy noted. Neurological: Alert and oriented to person place and time. Skin: Skin is warm and dry. No rashes noted.   ASSESSMENT AND PLAN:  1. 83 yo male with recent development of constipation, significantly improved with Clearlax.  -Continue daily Clearlax  2. Hx of colon polyps [facility] in 2011). He never got follow up, had been caring for ill wife who passed away in 12-16-2022.  -I recommended we get colonoscopy report then discuss whether surveillance colonoscopy is appropriate. We will send for the records but patient prefers to go ahead with a colonoscopy given bowel changes and history of polyps. He understands that his risk for procedures is increased given advanced age. He does seem to be in relatively good health and has a caregiver / friend for support.  Will schedule for colonoscopy with possible polypectomy.  The risks and benefits of the procedure were discussed and the patient agrees to proceed.   3. ? Hemorrhoids. He complains of swollen hemorrhoids / anal discomfort. I did not see any fissures. No external hemorrhoids on exam today. Suspect intermittent protrusion of internal hemorrhoid.  -Will refill anamantle per his request. He needs to apply inside rectal Q HS for 7 days.    4. COPD, does not use supplemental 02. On Theodur  5. HTN, controlled. BP today actually borderline low at 98/54. On norvasc and lisinopril.   Gabriel Savoy, NP   02/16/2017, 8:49 AM

## 2017-02-16 NOTE — Patient Instructions (Addendum)
If you are age 81 or older, your body mass index should be between 23-30. Your Body mass index is 26.85 kg/m. If this is out of the aforementioned range listed, please consider follow up with your Primary Care Provider.  If you are age 85 or younger, your body mass index should be between 19-25. Your Body mass index is 26.85 kg/m. If this is out of the aformentioned range listed, please consider follow up with your Primary Care Provider.   You have been scheduled for a colonoscopy. Please follow written instructions given to you at your visit today.  Please pick up your prep supplies at the pharmacy within the next 1-3 days. If you use inhalers (even only as needed), please bring them with you on the day of your procedure. Your physician has requested that you go to www.startemmi.com and enter the access code given to you at your visit today. This web site gives a general overview about your procedure. However, you should still follow specific instructions given to you by our office regarding your preparation for the procedure.  You may have a light breakfast the morning of prep day (the day before the procedure).   You may choose from one of the following items: eggs and toast OR chicken noodle soup and crackers.   You should have your breakfast completed between 8:00 and 9:00 am the day before your procedure.    After you have had your light breakfast you should start a clear liquid diet only, NO SOLIDS. No additional solid food is allowed. You may continue to have clear liquid up to 3 hours prior to your procedure.    Call us with the Name and Dose of Ananmantle so we can send to your pharmacy.  Thank you for choosing me and [facility].   Tye Savoy, NP

## 2017-02-17 ENCOUNTER — Telehealth: Payer: Self-pay | Admitting: Gastroenterology

## 2017-02-17 DIAGNOSIS — Z23 Encounter for immunization: Secondary | ICD-10-CM | POA: Diagnosis not present

## 2017-02-17 NOTE — Telephone Encounter (Signed)
Pt will come to the office to collect a free sample.

## 2017-02-20 DIAGNOSIS — G5621 Lesion of ulnar nerve, right upper limb: Secondary | ICD-10-CM | POA: Diagnosis not present

## 2017-02-20 DIAGNOSIS — G5601 Carpal tunnel syndrome, right upper limb: Secondary | ICD-10-CM | POA: Diagnosis not present

## 2017-02-20 NOTE — Progress Notes (Signed)
Thank you for sending this case to me. I have reviewed the entire note.  The colonoscopy is reasonable for the recent development of constipation, but he does not need it for history of polyps because he is past surveillance age.   Wilfrid Lund, MD

## 2017-02-26 DIAGNOSIS — L821 Other seborrheic keratosis: Secondary | ICD-10-CM | POA: Diagnosis not present

## 2017-02-26 DIAGNOSIS — D225 Melanocytic nevi of trunk: Secondary | ICD-10-CM | POA: Diagnosis not present

## 2017-02-26 DIAGNOSIS — X32XXXD Exposure to sunlight, subsequent encounter: Secondary | ICD-10-CM | POA: Diagnosis not present

## 2017-02-26 DIAGNOSIS — L570 Actinic keratosis: Secondary | ICD-10-CM | POA: Diagnosis not present

## 2017-02-27 ENCOUNTER — Telehealth: Payer: Self-pay | Admitting: Gastroenterology

## 2017-02-27 DIAGNOSIS — G5601 Carpal tunnel syndrome, right upper limb: Secondary | ICD-10-CM | POA: Diagnosis not present

## 2017-02-27 DIAGNOSIS — G5621 Lesion of ulnar nerve, right upper limb: Secondary | ICD-10-CM | POA: Diagnosis not present

## 2017-02-27 NOTE — Telephone Encounter (Signed)
Prep has been ready for pick up since 02-17-2017

## 2017-03-06 DIAGNOSIS — G5621 Lesion of ulnar nerve, right upper limb: Secondary | ICD-10-CM | POA: Diagnosis not present

## 2017-03-06 DIAGNOSIS — G5601 Carpal tunnel syndrome, right upper limb: Secondary | ICD-10-CM | POA: Diagnosis not present

## 2017-03-10 ENCOUNTER — Ambulatory Visit (AMBULATORY_SURGERY_CENTER): Payer: Medicare Other | Admitting: Gastroenterology

## 2017-03-10 ENCOUNTER — Encounter: Payer: Medicare Other | Admitting: Gastroenterology

## 2017-03-10 ENCOUNTER — Encounter: Payer: Self-pay | Admitting: Gastroenterology

## 2017-03-10 VITALS — BP 156/54 | HR 67 | Temp 98.6°F | Resp 15 | Ht 72.0 in | Wt 198.0 lb

## 2017-03-10 DIAGNOSIS — I2510 Atherosclerotic heart disease of native coronary artery without angina pectoris: Secondary | ICD-10-CM | POA: Diagnosis not present

## 2017-03-10 DIAGNOSIS — J449 Chronic obstructive pulmonary disease, unspecified: Secondary | ICD-10-CM | POA: Diagnosis not present

## 2017-03-10 DIAGNOSIS — K5900 Constipation, unspecified: Secondary | ICD-10-CM | POA: Diagnosis not present

## 2017-03-10 DIAGNOSIS — K219 Gastro-esophageal reflux disease without esophagitis: Secondary | ICD-10-CM | POA: Diagnosis not present

## 2017-03-10 DIAGNOSIS — D123 Benign neoplasm of transverse colon: Secondary | ICD-10-CM

## 2017-03-10 DIAGNOSIS — K649 Unspecified hemorrhoids: Secondary | ICD-10-CM | POA: Diagnosis not present

## 2017-03-10 MED ORDER — SODIUM CHLORIDE 0.9 % IV SOLN: 500.0000 mL | INTRAVENOUS | Status: DC

## 2017-03-10 NOTE — Patient Instructions (Signed)
YOU HAD AN ENDOSCOPIC PROCEDURE TODAY AT [facility]:   Refer to the procedure report that was given to you for any specific questions about what was found during the examination.  If the procedure report does not answer your questions, please call your gastroenterologist to clarify.  If you requested that your care partner not be given the details of your procedure findings, then the procedure report has been included in a sealed envelope for you to review at your convenience later.  YOU SHOULD EXPECT: Some feelings of bloating in the abdomen. Passage of more gas than usual.  Walking can help get rid of the air that was put into your GI tract during the procedure and reduce the bloating. If you had a lower endoscopy (such as a colonoscopy or flexible sigmoidoscopy) you may notice spotting of blood in your stool or on the toilet paper. If you underwent a bowel prep for your procedure, you may not have a normal bowel movement for a few days.  Please Note:  You might notice some irritation and congestion in your nose or some drainage.  This is from the oxygen used during your procedure.  There is no need for concern and it should clear up in a day or so.  SYMPTOMS TO REPORT IMMEDIATELY:   Following lower endoscopy (colonoscopy or flexible sigmoidoscopy):  Excessive amounts of blood in the stool  Significant tenderness or worsening of abdominal pains  Swelling of the abdomen that is new, acute  Fever of 100F or higher  For urgent or emergent issues, a gastroenterologist can be reached at any hour by calling [phone].   DIET:  We do recommend a small meal at first, but then you may proceed to your regular diet.  Drink plenty of fluids but you should avoid alcoholic beverages for 24 hours.  ACTIVITY:  You should plan to take it easy for the rest of today and you should NOT DRIVE or use heavy machinery until tomorrow (because of the sedation medicines used during the test).     FOLLOW UP: Our staff will call the number listed on your records the next business day following your procedure to check on you and address any questions or concerns that you may have regarding the information given to you following your procedure. If we do not reach you, we will leave a message.  However, if you are feeling well and you are not experiencing any problems, there is no need to return our call.  We will assume that you have returned to your regular daily activities without incident.  If any biopsies were taken you will be contacted by phone or by letter within the next 1-3 weeks.  Please call us at [phone] if you have not heard about the biopsies in 3 weeks.   Await for biopsy results  Polyps (handout given) Diverticulosis (handout given) No further screening Colonoscopy needed Continue present medications, including clearlax for relief of constipation   SIGNATURES/CONFIDENTIALITY: You and/or your care partner have signed paperwork which will be entered into your electronic medical record.  These signatures attest to the fact that that the information above on your After Visit Summary has been reviewed and is understood.  Full responsibility of the confidentiality of this discharge information lies with you and/or your care-partner.

## 2017-03-10 NOTE — Progress Notes (Signed)
Called to room to assist during endoscopic procedure.  Patient ID and intended procedure confirmed with present staff. Received instructions for my participation in the procedure from the performing physician.  

## 2017-03-10 NOTE — Op Note (Signed)
[place] Patient Name: Gabriel Mcdonald Procedure Date: 03/10/2017 1:13 PM MRN: [id number] Endoscopist: Mallie Mussel L. Loletha Carrow , MD Age: 83 Referring MD:  Date of Birth: 1932/04/03 Gender: Male Account #: [id number] Procedure:                Colonoscopy Indications:              Constipation Medicines:                Monitored Anesthesia Care Procedure:                Pre-Anesthesia Assessment:                           - Prior to the procedure, a History and Physical                            was performed, and patient medications and                            allergies were reviewed. The patient's tolerance of                            previous anesthesia was also reviewed. The risks                            and benefits of the procedure and the sedation                            options and risks were discussed with the patient.                            All questions were answered, and informed consent                            was obtained. Anticoagulants: The patient has taken                            aspirin. It was decided not to withhold this                            medication prior to the procedure. ASA Grade                            Assessment: III - A patient with severe systemic                            disease. After reviewing the risks and benefits,                            the patient was deemed in satisfactory condition to                            undergo the procedure.  After obtaining informed consent, the colonoscope                            was passed under direct vision. Throughout the                            procedure, the patient's blood pressure, pulse, and                            oxygen saturations were monitored continuously. The                            Colonoscope was introduced through the anus and                            advanced to the the cecum, identified by   appendiceal orifice and ileocecal valve. The                            colonoscopy was performed without difficulty. The                            patient tolerated the procedure well. The quality                            of the bowel preparation was good. The ileocecal                            valve, appendiceal orifice, and rectum were                            photographed. The quality of the bowel preparation                            was evaluated using the BBPS [facility] Bowel                            Preparation Scale) with scores of: Right Colon = 2,                            Transverse Colon = 2 and Left Colon = 2. The total                            BBPS score equals 6. After lavage. The bowel                            preparation used was SUPREP. Scope In: 1:21:19 PM Scope Out: 1:35:47 PM Scope Withdrawal Time: 0 hours 10 minutes 58 seconds  Total Procedure Duration: 0 hours 14 minutes 28 seconds  Findings:                 The perianal and digital rectal examinations were  normal.                           A 2 mm polyp was found in the hepatic flexure. The                            polyp was sessile. The polyp was removed with a                            cold biopsy forceps. Resection and retrieval were                            complete.                           Many diverticula were found in the left colon, with                            associated tortuosity.                           There was evidence of a prior hemorrhoidectomy at                            anal verge. Complications:            No immediate complications. Estimated Blood Loss:     Estimated blood loss: none. Impression:               - One 2 mm polyp at the hepatic flexure, removed                            with a cold biopsy forceps. Resected and retrieved.                           - Diverticulosis in the left colon.                           - Evidence of  prior hemorrhoidectomy.                           Constipation appears due to diverticulosis and                            probable decreased motility. Recommendation:           - Patient has a contact number available for                            emergencies. The signs and symptoms of potential                            delayed complications were discussed with the                            patient. Return to normal activities tomorrow.  Written discharge instructions were provided to the                            patient.                           - Resume previous diet.                           - Continue present medications, including clearlax                            for relief of constipation.                           - Await pathology results.                           - No future routine polyp surveillance colonoscopy                            necessary due to age. Taisei Bonnette L. Loletha Carrow, MD 03/10/2017 1:44:23 PM This report has been signed electronically.

## 2017-03-10 NOTE — Progress Notes (Signed)
Report given to PACU, vss 

## 2017-03-10 NOTE — Progress Notes (Signed)
Pt's states no medical or surgical changes since previsit or office visit. 

## 2017-03-11 ENCOUNTER — Telehealth: Payer: Self-pay

## 2017-03-11 NOTE — Telephone Encounter (Signed)
  Follow up Call-  Call back number 03/10/2017  Post procedure Call Back phone  # [phone] phone  Permission to leave phone message Yes  Some recent data might be hidden     Patient questions:  Do you have a fever, pain , or abdominal swelling? No. Pain Score  0 *  Have you tolerated food without any problems? Yes.    Have you been able to return to your normal activities? Yes.    Do you have any questions about your discharge instructions: Diet   No. Medications  No. Follow up visit  No.  Do you have questions or concerns about your Care? No.  Actions: * If pain score is 4 or above: No action needed, pain <4.

## 2017-03-18 DIAGNOSIS — M79642 Pain in left hand: Secondary | ICD-10-CM | POA: Diagnosis not present

## 2017-03-18 DIAGNOSIS — G5603 Carpal tunnel syndrome, bilateral upper limbs: Secondary | ICD-10-CM | POA: Diagnosis not present

## 2017-03-18 DIAGNOSIS — M6281 Muscle weakness (generalized): Secondary | ICD-10-CM | POA: Diagnosis not present

## 2017-03-18 DIAGNOSIS — M79641 Pain in right hand: Secondary | ICD-10-CM | POA: Diagnosis not present

## 2017-04-30 DIAGNOSIS — R5383 Other fatigue: Secondary | ICD-10-CM | POA: Diagnosis not present

## 2017-04-30 DIAGNOSIS — I10 Essential (primary) hypertension: Secondary | ICD-10-CM | POA: Diagnosis not present

## 2017-04-30 DIAGNOSIS — Z79899 Other long term (current) drug therapy: Secondary | ICD-10-CM | POA: Diagnosis not present

## 2017-04-30 DIAGNOSIS — K5909 Other constipation: Secondary | ICD-10-CM | POA: Diagnosis not present

## 2017-04-30 DIAGNOSIS — Z951 Presence of aortocoronary bypass graft: Secondary | ICD-10-CM | POA: Diagnosis not present

## 2017-04-30 DIAGNOSIS — R05 Cough: Secondary | ICD-10-CM | POA: Diagnosis not present

## 2017-04-30 DIAGNOSIS — J440 Chronic obstructive pulmonary disease with acute lower respiratory infection: Secondary | ICD-10-CM | POA: Diagnosis not present

## 2017-04-30 DIAGNOSIS — M545 Low back pain: Secondary | ICD-10-CM | POA: Diagnosis not present

## 2017-07-22 DIAGNOSIS — G5622 Lesion of ulnar nerve, left upper limb: Secondary | ICD-10-CM | POA: Diagnosis not present

## 2017-07-22 DIAGNOSIS — G5602 Carpal tunnel syndrome, left upper limb: Secondary | ICD-10-CM | POA: Diagnosis not present

## 2017-07-22 DIAGNOSIS — M79642 Pain in left hand: Secondary | ICD-10-CM | POA: Diagnosis not present

## 2017-08-04 DIAGNOSIS — J441 Chronic obstructive pulmonary disease with (acute) exacerbation: Secondary | ICD-10-CM | POA: Diagnosis not present

## 2017-08-04 DIAGNOSIS — I10 Essential (primary) hypertension: Secondary | ICD-10-CM | POA: Diagnosis not present

## 2017-08-04 DIAGNOSIS — E782 Mixed hyperlipidemia: Secondary | ICD-10-CM | POA: Diagnosis not present

## 2017-08-04 DIAGNOSIS — K210 Gastro-esophageal reflux disease with esophagitis: Secondary | ICD-10-CM | POA: Diagnosis not present

## 2017-08-25 DIAGNOSIS — G5622 Lesion of ulnar nerve, left upper limb: Secondary | ICD-10-CM | POA: Diagnosis not present

## 2017-08-25 DIAGNOSIS — G5602 Carpal tunnel syndrome, left upper limb: Secondary | ICD-10-CM | POA: Diagnosis not present

## 2017-09-08 DIAGNOSIS — G5602 Carpal tunnel syndrome, left upper limb: Secondary | ICD-10-CM | POA: Diagnosis not present

## 2017-09-17 DIAGNOSIS — L570 Actinic keratosis: Secondary | ICD-10-CM | POA: Diagnosis not present

## 2017-10-02 DIAGNOSIS — G5602 Carpal tunnel syndrome, left upper limb: Secondary | ICD-10-CM | POA: Diagnosis not present

## 2017-10-13 DIAGNOSIS — S81801A Unspecified open wound, right lower leg, initial encounter: Secondary | ICD-10-CM | POA: Diagnosis not present

## 2017-10-13 DIAGNOSIS — B351 Tinea unguium: Secondary | ICD-10-CM | POA: Diagnosis not present

## 2017-11-04 DIAGNOSIS — K210 Gastro-esophageal reflux disease with esophagitis: Secondary | ICD-10-CM | POA: Diagnosis not present

## 2017-11-04 DIAGNOSIS — I10 Essential (primary) hypertension: Secondary | ICD-10-CM | POA: Diagnosis not present

## 2017-11-04 DIAGNOSIS — E7849 Other hyperlipidemia: Secondary | ICD-10-CM | POA: Diagnosis not present

## 2017-11-04 DIAGNOSIS — J441 Chronic obstructive pulmonary disease with (acute) exacerbation: Secondary | ICD-10-CM | POA: Diagnosis not present

## 2017-12-03 DIAGNOSIS — M1711 Unilateral primary osteoarthritis, right knee: Secondary | ICD-10-CM | POA: Diagnosis not present

## 2017-12-03 DIAGNOSIS — M1712 Unilateral primary osteoarthritis, left knee: Secondary | ICD-10-CM | POA: Diagnosis not present

## 2017-12-08 DIAGNOSIS — S43422A Sprain of left rotator cuff capsule, initial encounter: Secondary | ICD-10-CM | POA: Diagnosis not present

## 2017-12-09 DIAGNOSIS — M75121 Complete rotator cuff tear or rupture of right shoulder, not specified as traumatic: Secondary | ICD-10-CM | POA: Diagnosis not present

## 2017-12-09 DIAGNOSIS — M25511 Pain in right shoulder: Secondary | ICD-10-CM | POA: Diagnosis not present

## 2017-12-15 DIAGNOSIS — M48062 Spinal stenosis, lumbar region with neurogenic claudication: Secondary | ICD-10-CM | POA: Diagnosis not present

## 2018-01-06 DIAGNOSIS — M75121 Complete rotator cuff tear or rupture of right shoulder, not specified as traumatic: Secondary | ICD-10-CM | POA: Diagnosis not present

## 2018-01-07 DIAGNOSIS — M1712 Unilateral primary osteoarthritis, left knee: Secondary | ICD-10-CM | POA: Diagnosis not present

## 2018-01-07 DIAGNOSIS — M1711 Unilateral primary osteoarthritis, right knee: Secondary | ICD-10-CM | POA: Diagnosis not present

## 2018-01-15 DIAGNOSIS — M170 Bilateral primary osteoarthritis of knee: Secondary | ICD-10-CM | POA: Diagnosis not present

## 2018-01-18 DIAGNOSIS — M25511 Pain in right shoulder: Secondary | ICD-10-CM | POA: Diagnosis not present

## 2018-01-18 DIAGNOSIS — M75121 Complete rotator cuff tear or rupture of right shoulder, not specified as traumatic: Secondary | ICD-10-CM | POA: Diagnosis not present

## 2018-01-20 DIAGNOSIS — M25511 Pain in right shoulder: Secondary | ICD-10-CM | POA: Diagnosis not present

## 2018-01-20 DIAGNOSIS — M75121 Complete rotator cuff tear or rupture of right shoulder, not specified as traumatic: Secondary | ICD-10-CM | POA: Diagnosis not present

## 2018-01-21 DIAGNOSIS — M1712 Unilateral primary osteoarthritis, left knee: Secondary | ICD-10-CM | POA: Diagnosis not present

## 2018-01-21 DIAGNOSIS — M1711 Unilateral primary osteoarthritis, right knee: Secondary | ICD-10-CM | POA: Diagnosis not present

## 2018-01-22 DIAGNOSIS — M75121 Complete rotator cuff tear or rupture of right shoulder, not specified as traumatic: Secondary | ICD-10-CM | POA: Diagnosis not present

## 2018-01-22 DIAGNOSIS — M25511 Pain in right shoulder: Secondary | ICD-10-CM | POA: Diagnosis not present

## 2018-01-24 DIAGNOSIS — Z955 Presence of coronary angioplasty implant and graft: Secondary | ICD-10-CM | POA: Diagnosis not present

## 2018-01-24 DIAGNOSIS — N132 Hydronephrosis with renal and ureteral calculous obstruction: Secondary | ICD-10-CM | POA: Diagnosis not present

## 2018-01-24 DIAGNOSIS — E7800 Pure hypercholesterolemia, unspecified: Secondary | ICD-10-CM | POA: Diagnosis not present

## 2018-01-24 DIAGNOSIS — N200 Calculus of kidney: Secondary | ICD-10-CM | POA: Diagnosis not present

## 2018-01-24 DIAGNOSIS — K219 Gastro-esophageal reflux disease without esophagitis: Secondary | ICD-10-CM | POA: Diagnosis not present

## 2018-01-24 DIAGNOSIS — Z7982 Long term (current) use of aspirin: Secondary | ICD-10-CM | POA: Diagnosis not present

## 2018-01-24 DIAGNOSIS — N201 Calculus of ureter: Secondary | ICD-10-CM | POA: Diagnosis not present

## 2018-01-24 DIAGNOSIS — I700 Atherosclerosis of aorta: Secondary | ICD-10-CM | POA: Diagnosis not present

## 2018-01-24 DIAGNOSIS — Z79899 Other long term (current) drug therapy: Secondary | ICD-10-CM | POA: Diagnosis not present

## 2018-01-24 DIAGNOSIS — R001 Bradycardia, unspecified: Secondary | ICD-10-CM | POA: Diagnosis not present

## 2018-01-24 DIAGNOSIS — Z87891 Personal history of nicotine dependence: Secondary | ICD-10-CM | POA: Diagnosis not present

## 2018-01-24 DIAGNOSIS — Z8546 Personal history of malignant neoplasm of prostate: Secondary | ICD-10-CM | POA: Diagnosis not present

## 2018-01-24 DIAGNOSIS — I10 Essential (primary) hypertension: Secondary | ICD-10-CM | POA: Diagnosis not present

## 2018-01-24 DIAGNOSIS — Z87442 Personal history of urinary calculi: Secondary | ICD-10-CM | POA: Diagnosis not present

## 2018-01-24 DIAGNOSIS — R109 Unspecified abdominal pain: Secondary | ICD-10-CM | POA: Diagnosis not present

## 2018-01-24 DIAGNOSIS — I2510 Atherosclerotic heart disease of native coronary artery without angina pectoris: Secondary | ICD-10-CM | POA: Diagnosis not present

## 2018-01-26 DIAGNOSIS — M75121 Complete rotator cuff tear or rupture of right shoulder, not specified as traumatic: Secondary | ICD-10-CM | POA: Diagnosis not present

## 2018-02-02 DIAGNOSIS — M75121 Complete rotator cuff tear or rupture of right shoulder, not specified as traumatic: Secondary | ICD-10-CM | POA: Diagnosis not present

## 2018-02-03 DIAGNOSIS — M75111 Incomplete rotator cuff tear or rupture of right shoulder, not specified as traumatic: Secondary | ICD-10-CM | POA: Diagnosis not present

## 2018-02-03 DIAGNOSIS — I10 Essential (primary) hypertension: Secondary | ICD-10-CM | POA: Diagnosis not present

## 2018-02-03 DIAGNOSIS — M19011 Primary osteoarthritis, right shoulder: Secondary | ICD-10-CM | POA: Diagnosis not present

## 2018-02-04 DIAGNOSIS — R109 Unspecified abdominal pain: Secondary | ICD-10-CM | POA: Diagnosis not present

## 2018-02-04 DIAGNOSIS — N201 Calculus of ureter: Secondary | ICD-10-CM | POA: Diagnosis not present

## 2018-02-16 DIAGNOSIS — M94211 Chondromalacia, right shoulder: Secondary | ICD-10-CM | POA: Diagnosis not present

## 2018-02-16 DIAGNOSIS — Y999 Unspecified external cause status: Secondary | ICD-10-CM | POA: Diagnosis not present

## 2018-02-16 DIAGNOSIS — X58XXXA Exposure to other specified factors, initial encounter: Secondary | ICD-10-CM | POA: Diagnosis not present

## 2018-02-16 DIAGNOSIS — S46011A Strain of muscle(s) and tendon(s) of the rotator cuff of right shoulder, initial encounter: Secondary | ICD-10-CM | POA: Diagnosis not present

## 2018-02-16 DIAGNOSIS — S43431A Superior glenoid labrum lesion of right shoulder, initial encounter: Secondary | ICD-10-CM | POA: Diagnosis not present

## 2018-02-16 DIAGNOSIS — M7551 Bursitis of right shoulder: Secondary | ICD-10-CM | POA: Diagnosis not present

## 2018-02-16 DIAGNOSIS — G8918 Other acute postprocedural pain: Secondary | ICD-10-CM | POA: Diagnosis not present

## 2018-02-16 DIAGNOSIS — S46191A Other injury of muscle, fascia and tendon of long head of biceps, right arm, initial encounter: Secondary | ICD-10-CM | POA: Diagnosis not present

## 2018-02-16 DIAGNOSIS — S46091A Other injury of muscle(s) and tendon(s) of the rotator cuff of right shoulder, initial encounter: Secondary | ICD-10-CM | POA: Diagnosis not present

## 2018-02-24 DIAGNOSIS — M25511 Pain in right shoulder: Secondary | ICD-10-CM | POA: Diagnosis not present

## 2018-02-26 DIAGNOSIS — M25511 Pain in right shoulder: Secondary | ICD-10-CM | POA: Diagnosis not present

## 2018-03-02 DIAGNOSIS — M25519 Pain in unspecified shoulder: Secondary | ICD-10-CM | POA: Diagnosis not present

## 2018-03-02 DIAGNOSIS — Z23 Encounter for immunization: Secondary | ICD-10-CM | POA: Diagnosis not present

## 2018-03-04 DIAGNOSIS — M170 Bilateral primary osteoarthritis of knee: Secondary | ICD-10-CM | POA: Diagnosis not present

## 2018-03-05 DIAGNOSIS — M25511 Pain in right shoulder: Secondary | ICD-10-CM | POA: Diagnosis not present

## 2018-03-08 DIAGNOSIS — M25519 Pain in unspecified shoulder: Secondary | ICD-10-CM | POA: Diagnosis not present

## 2018-03-11 DIAGNOSIS — M25511 Pain in right shoulder: Secondary | ICD-10-CM | POA: Diagnosis not present

## 2018-03-15 DIAGNOSIS — I10 Essential (primary) hypertension: Secondary | ICD-10-CM | POA: Diagnosis not present

## 2018-03-15 DIAGNOSIS — Z79899 Other long term (current) drug therapy: Secondary | ICD-10-CM | POA: Diagnosis not present

## 2018-03-16 DIAGNOSIS — M25511 Pain in right shoulder: Secondary | ICD-10-CM | POA: Diagnosis not present

## 2018-03-18 DIAGNOSIS — M25511 Pain in right shoulder: Secondary | ICD-10-CM | POA: Diagnosis not present

## 2018-03-23 DIAGNOSIS — M25511 Pain in right shoulder: Secondary | ICD-10-CM | POA: Diagnosis not present

## 2018-03-25 DIAGNOSIS — M25519 Pain in unspecified shoulder: Secondary | ICD-10-CM | POA: Diagnosis not present

## 2018-03-30 DIAGNOSIS — M25519 Pain in unspecified shoulder: Secondary | ICD-10-CM | POA: Diagnosis not present

## 2018-04-02 DIAGNOSIS — M25519 Pain in unspecified shoulder: Secondary | ICD-10-CM | POA: Diagnosis not present

## 2018-04-06 DIAGNOSIS — M25511 Pain in right shoulder: Secondary | ICD-10-CM | POA: Diagnosis not present

## 2018-04-09 DIAGNOSIS — M25511 Pain in right shoulder: Secondary | ICD-10-CM | POA: Diagnosis not present

## 2018-04-13 DIAGNOSIS — M25511 Pain in right shoulder: Secondary | ICD-10-CM | POA: Diagnosis not present

## 2018-04-16 DIAGNOSIS — M25511 Pain in right shoulder: Secondary | ICD-10-CM | POA: Diagnosis not present

## 2018-04-20 DIAGNOSIS — Z7982 Long term (current) use of aspirin: Secondary | ICD-10-CM | POA: Diagnosis not present

## 2018-04-20 DIAGNOSIS — N200 Calculus of kidney: Secondary | ICD-10-CM | POA: Diagnosis not present

## 2018-04-20 DIAGNOSIS — K219 Gastro-esophageal reflux disease without esophagitis: Secondary | ICD-10-CM | POA: Diagnosis not present

## 2018-04-20 DIAGNOSIS — K5730 Diverticulosis of large intestine without perforation or abscess without bleeding: Secondary | ICD-10-CM | POA: Diagnosis not present

## 2018-04-20 DIAGNOSIS — I7773 Dissection of renal artery: Secondary | ICD-10-CM | POA: Diagnosis not present

## 2018-04-20 DIAGNOSIS — R109 Unspecified abdominal pain: Secondary | ICD-10-CM | POA: Diagnosis not present

## 2018-04-20 DIAGNOSIS — Z792 Long term (current) use of antibiotics: Secondary | ICD-10-CM | POA: Diagnosis not present

## 2018-04-20 DIAGNOSIS — Z8546 Personal history of malignant neoplasm of prostate: Secondary | ICD-10-CM | POA: Diagnosis not present

## 2018-04-20 DIAGNOSIS — Z79899 Other long term (current) drug therapy: Secondary | ICD-10-CM | POA: Diagnosis not present

## 2018-04-20 DIAGNOSIS — Z955 Presence of coronary angioplasty implant and graft: Secondary | ICD-10-CM | POA: Diagnosis not present

## 2018-04-20 DIAGNOSIS — Z87891 Personal history of nicotine dependence: Secondary | ICD-10-CM | POA: Diagnosis not present

## 2018-04-20 DIAGNOSIS — I10 Essential (primary) hypertension: Secondary | ICD-10-CM | POA: Diagnosis not present

## 2018-04-20 DIAGNOSIS — E7800 Pure hypercholesterolemia, unspecified: Secondary | ICD-10-CM | POA: Diagnosis not present

## 2018-04-20 DIAGNOSIS — Z885 Allergy status to narcotic agent status: Secondary | ICD-10-CM | POA: Diagnosis not present

## 2018-04-20 DIAGNOSIS — I7102 Dissection of abdominal aorta: Secondary | ICD-10-CM | POA: Diagnosis not present

## 2018-04-27 DIAGNOSIS — M25511 Pain in right shoulder: Secondary | ICD-10-CM | POA: Diagnosis not present

## 2018-04-30 DIAGNOSIS — M25511 Pain in right shoulder: Secondary | ICD-10-CM | POA: Diagnosis not present

## 2018-05-04 DIAGNOSIS — Z1389 Encounter for screening for other disorder: Secondary | ICD-10-CM | POA: Diagnosis not present

## 2018-05-04 DIAGNOSIS — Z0000 Encounter for general adult medical examination without abnormal findings: Secondary | ICD-10-CM | POA: Diagnosis not present

## 2018-05-04 DIAGNOSIS — I10 Essential (primary) hypertension: Secondary | ICD-10-CM | POA: Diagnosis not present

## 2018-05-04 DIAGNOSIS — M545 Low back pain: Secondary | ICD-10-CM | POA: Diagnosis not present

## 2018-05-04 DIAGNOSIS — J4520 Mild intermittent asthma, uncomplicated: Secondary | ICD-10-CM | POA: Diagnosis not present

## 2018-06-03 DIAGNOSIS — H353110 Nonexudative age-related macular degeneration, right eye, stage unspecified: Secondary | ICD-10-CM | POA: Diagnosis not present

## 2018-06-15 DIAGNOSIS — M810 Age-related osteoporosis without current pathological fracture: Secondary | ICD-10-CM | POA: Diagnosis not present

## 2018-06-17 DIAGNOSIS — L570 Actinic keratosis: Secondary | ICD-10-CM | POA: Diagnosis not present

## 2018-06-30 DIAGNOSIS — M79645 Pain in left finger(s): Secondary | ICD-10-CM | POA: Diagnosis not present

## 2018-06-30 DIAGNOSIS — M65332 Trigger finger, left middle finger: Secondary | ICD-10-CM | POA: Diagnosis not present

## 2018-07-28 DIAGNOSIS — M170 Bilateral primary osteoarthritis of knee: Secondary | ICD-10-CM | POA: Diagnosis not present

## 2018-07-29 DIAGNOSIS — M65332 Trigger finger, left middle finger: Secondary | ICD-10-CM | POA: Diagnosis not present

## 2018-07-29 DIAGNOSIS — M79645 Pain in left finger(s): Secondary | ICD-10-CM | POA: Diagnosis not present

## 2018-08-04 DIAGNOSIS — M170 Bilateral primary osteoarthritis of knee: Secondary | ICD-10-CM | POA: Diagnosis not present

## 2018-08-04 DIAGNOSIS — M25561 Pain in right knee: Secondary | ICD-10-CM | POA: Diagnosis not present

## 2018-08-13 DIAGNOSIS — M170 Bilateral primary osteoarthritis of knee: Secondary | ICD-10-CM | POA: Diagnosis not present

## 2018-12-23 ENCOUNTER — Other Ambulatory Visit: Payer: Self-pay

## 2019-01-26 DIAGNOSIS — I10 Essential (primary) hypertension: Secondary | ICD-10-CM | POA: Diagnosis not present

## 2019-01-26 DIAGNOSIS — M48062 Spinal stenosis, lumbar region with neurogenic claudication: Secondary | ICD-10-CM | POA: Diagnosis not present

## 2019-01-26 DIAGNOSIS — Z6826 Body mass index (BMI) 26.0-26.9, adult: Secondary | ICD-10-CM | POA: Diagnosis not present

## 2019-01-27 DIAGNOSIS — Z23 Encounter for immunization: Secondary | ICD-10-CM | POA: Diagnosis not present

## 2019-02-02 DIAGNOSIS — M47816 Spondylosis without myelopathy or radiculopathy, lumbar region: Secondary | ICD-10-CM | POA: Diagnosis not present

## 2019-02-02 DIAGNOSIS — Z6826 Body mass index (BMI) 26.0-26.9, adult: Secondary | ICD-10-CM | POA: Diagnosis not present

## 2019-02-02 DIAGNOSIS — I10 Essential (primary) hypertension: Secondary | ICD-10-CM | POA: Diagnosis not present

## 2019-02-16 DIAGNOSIS — M47816 Spondylosis without myelopathy or radiculopathy, lumbar region: Secondary | ICD-10-CM | POA: Diagnosis not present

## 2019-02-17 DIAGNOSIS — M25562 Pain in left knee: Secondary | ICD-10-CM | POA: Diagnosis not present

## 2019-02-17 DIAGNOSIS — M25561 Pain in right knee: Secondary | ICD-10-CM | POA: Diagnosis not present

## 2019-02-17 DIAGNOSIS — M170 Bilateral primary osteoarthritis of knee: Secondary | ICD-10-CM | POA: Diagnosis not present

## 2019-02-24 DIAGNOSIS — M170 Bilateral primary osteoarthritis of knee: Secondary | ICD-10-CM | POA: Diagnosis not present

## 2019-03-03 DIAGNOSIS — M170 Bilateral primary osteoarthritis of knee: Secondary | ICD-10-CM | POA: Diagnosis not present

## 2019-03-08 DIAGNOSIS — M47816 Spondylosis without myelopathy or radiculopathy, lumbar region: Secondary | ICD-10-CM | POA: Diagnosis not present

## 2019-05-04 DIAGNOSIS — K219 Gastro-esophageal reflux disease without esophagitis: Secondary | ICD-10-CM | POA: Diagnosis not present

## 2019-05-04 DIAGNOSIS — J4520 Mild intermittent asthma, uncomplicated: Secondary | ICD-10-CM | POA: Diagnosis not present

## 2019-05-04 DIAGNOSIS — I10 Essential (primary) hypertension: Secondary | ICD-10-CM | POA: Diagnosis not present

## 2019-05-04 DIAGNOSIS — M545 Low back pain: Secondary | ICD-10-CM | POA: Diagnosis not present

## 2019-06-07 DIAGNOSIS — Z23 Encounter for immunization: Secondary | ICD-10-CM | POA: Diagnosis not present

## 2019-08-04 DIAGNOSIS — S51811A Laceration without foreign body of right forearm, initial encounter: Secondary | ICD-10-CM | POA: Diagnosis not present

## 2019-09-07 DIAGNOSIS — M170 Bilateral primary osteoarthritis of knee: Secondary | ICD-10-CM | POA: Diagnosis not present

## 2019-09-14 DIAGNOSIS — M170 Bilateral primary osteoarthritis of knee: Secondary | ICD-10-CM | POA: Diagnosis not present

## 2019-09-21 DIAGNOSIS — M170 Bilateral primary osteoarthritis of knee: Secondary | ICD-10-CM | POA: Diagnosis not present

## 2019-10-23 DIAGNOSIS — N3091 Cystitis, unspecified with hematuria: Secondary | ICD-10-CM | POA: Diagnosis not present

## 2019-10-23 DIAGNOSIS — B3749 Other urogenital candidiasis: Secondary | ICD-10-CM | POA: Diagnosis not present

## 2019-12-28 DIAGNOSIS — H0279 Other degenerative disorders of eyelid and periocular area: Secondary | ICD-10-CM | POA: Diagnosis not present

## 2019-12-28 DIAGNOSIS — Z1811 Retained magnetic metal fragments: Secondary | ICD-10-CM | POA: Diagnosis not present

## 2019-12-28 DIAGNOSIS — H02413 Mechanical ptosis of bilateral eyelids: Secondary | ICD-10-CM | POA: Diagnosis not present

## 2019-12-28 DIAGNOSIS — H02834 Dermatochalasis of left upper eyelid: Secondary | ICD-10-CM | POA: Diagnosis not present

## 2019-12-28 DIAGNOSIS — H57813 Brow ptosis, bilateral: Secondary | ICD-10-CM | POA: Diagnosis not present

## 2019-12-28 DIAGNOSIS — H02831 Dermatochalasis of right upper eyelid: Secondary | ICD-10-CM | POA: Diagnosis not present

## 2019-12-28 DIAGNOSIS — H53483 Generalized contraction of visual field, bilateral: Secondary | ICD-10-CM | POA: Diagnosis not present

## 2019-12-29 DIAGNOSIS — H53482 Generalized contraction of visual field, left eye: Secondary | ICD-10-CM | POA: Diagnosis not present

## 2019-12-29 DIAGNOSIS — H53481 Generalized contraction of visual field, right eye: Secondary | ICD-10-CM | POA: Diagnosis not present

## 2019-12-29 DIAGNOSIS — H53483 Generalized contraction of visual field, bilateral: Secondary | ICD-10-CM | POA: Diagnosis not present

## 2020-02-01 ENCOUNTER — Other Ambulatory Visit: Payer: Self-pay

## 2020-02-01 DIAGNOSIS — H02831 Dermatochalasis of right upper eyelid: Secondary | ICD-10-CM | POA: Diagnosis not present

## 2020-02-01 DIAGNOSIS — H57813 Brow ptosis, bilateral: Secondary | ICD-10-CM | POA: Diagnosis not present

## 2020-02-01 DIAGNOSIS — H53483 Generalized contraction of visual field, bilateral: Secondary | ICD-10-CM | POA: Diagnosis not present

## 2020-02-01 DIAGNOSIS — H0279 Other degenerative disorders of eyelid and periocular area: Secondary | ICD-10-CM | POA: Diagnosis not present

## 2020-02-01 DIAGNOSIS — D179 Benign lipomatous neoplasm, unspecified: Secondary | ICD-10-CM | POA: Diagnosis not present

## 2020-02-01 DIAGNOSIS — D170 Benign lipomatous neoplasm of skin and subcutaneous tissue of head, face and neck: Secondary | ICD-10-CM | POA: Diagnosis not present

## 2020-02-01 DIAGNOSIS — H53482 Generalized contraction of visual field, left eye: Secondary | ICD-10-CM | POA: Diagnosis not present

## 2020-02-01 DIAGNOSIS — Z1811 Retained magnetic metal fragments: Secondary | ICD-10-CM | POA: Diagnosis not present

## 2020-02-01 DIAGNOSIS — H02834 Dermatochalasis of left upper eyelid: Secondary | ICD-10-CM | POA: Diagnosis not present

## 2020-02-01 DIAGNOSIS — H02413 Mechanical ptosis of bilateral eyelids: Secondary | ICD-10-CM | POA: Diagnosis not present

## 2020-02-01 DIAGNOSIS — H53481 Generalized contraction of visual field, right eye: Secondary | ICD-10-CM | POA: Diagnosis not present

## 2020-02-01 DIAGNOSIS — W458XXS Other foreign body or object entering through skin, sequela: Secondary | ICD-10-CM | POA: Diagnosis not present

## 2020-03-27 DIAGNOSIS — M170 Bilateral primary osteoarthritis of knee: Secondary | ICD-10-CM | POA: Diagnosis not present

## 2020-04-03 DIAGNOSIS — M1711 Unilateral primary osteoarthritis, right knee: Secondary | ICD-10-CM | POA: Diagnosis not present

## 2020-04-04 DIAGNOSIS — L570 Actinic keratosis: Secondary | ICD-10-CM | POA: Diagnosis not present

## 2020-04-04 DIAGNOSIS — J4520 Mild intermittent asthma, uncomplicated: Secondary | ICD-10-CM | POA: Diagnosis not present

## 2020-04-04 DIAGNOSIS — K219 Gastro-esophageal reflux disease without esophagitis: Secondary | ICD-10-CM | POA: Diagnosis not present

## 2020-04-04 DIAGNOSIS — M25561 Pain in right knee: Secondary | ICD-10-CM | POA: Diagnosis not present

## 2020-04-04 DIAGNOSIS — I10 Essential (primary) hypertension: Secondary | ICD-10-CM | POA: Diagnosis not present

## 2020-04-10 DIAGNOSIS — M170 Bilateral primary osteoarthritis of knee: Secondary | ICD-10-CM | POA: Diagnosis not present

## 2020-04-10 DIAGNOSIS — M1711 Unilateral primary osteoarthritis, right knee: Secondary | ICD-10-CM | POA: Diagnosis not present

## 2020-04-10 DIAGNOSIS — M1712 Unilateral primary osteoarthritis, left knee: Secondary | ICD-10-CM | POA: Diagnosis not present

## 2020-04-17 DIAGNOSIS — Z23 Encounter for immunization: Secondary | ICD-10-CM | POA: Diagnosis not present

## 2020-04-26 DIAGNOSIS — M170 Bilateral primary osteoarthritis of knee: Secondary | ICD-10-CM | POA: Diagnosis not present

## 2020-05-07 NOTE — Patient Instructions (Addendum)
DUE TO COVID-19 ONLY ONE VISITOR IS ALLOWED TO COME WITH YOU AND STAY IN THE WAITING ROOM ONLY DURING PRE OP AND PROCEDURE DAY OF SURGERY. THE 1 VISITOR  MAY VISIT WITH YOU AFTER SURGERY IN YOUR PRIVATE ROOM DURING VISITING HOURS ONLY!  YOU NEED TO HAVE A COVID 19 TEST ON__12/16_____ @__1 :45 pm_____, THIS TEST MUST BE DONE BEFORE SURGERY,  COVID TESTING SITE [place] [id number], IT IS ON THE RIGHT GOING OUT [address] APPROXIMATELY  2 MINUTES PAST [employer] ON THE RIGHT. ONCE YOUR COVID TEST IS COMPLETED,  PLEASE BEGIN THE QUARANTINE INSTRUCTIONS AS OUTLINED IN YOUR HANDOUT.                Gabriel Mcdonald    Your procedure is scheduled on: 05/14/20   Report to [facility] Main  Entrance   Report to admitting at  11:15 AM     Call this number if you have problems the morning of surgery Gabriel Mcdonald, NO CHEWING GUM Gabriel Mcdonald.   No food after midnight.    You may have clear liquid until 10:00 AM.    CLEAR LIQUID DIET   Foods Allowed                                                                     Foods Excluded  Coffee and tea, regular and decaf                             liquids that you cannot  Plain Jell-O any favor except red or purple                                           see through such as: Fruit ices (not with fruit pulp)                                     milk, soups, orange juice  Iced Popsicles                                    All solid food Carbonated beverages, regular and diet                                    Cranberry, grape and apple juices Sports drinks like Gatorade Lightly seasoned clear broth or consume(fat free) Sugar, honey syrup       At 9:30 AM drink pre surgery drink.   Nothing by mouth after 10:00 AM.   Take these medicines the morning of surgery with A SIP OF WATER:  Use inhalers and bring with you.  Theodur, Amlodipine,  Metoprolol if it is the day you take it Tamsulosin, Omeprizole  You may not have any metal on your body including              piercings  Do not wear jewelry,  lotions, powders or deodorant         y.              Men may shave face and neck.   Do not bring valuables to the hospital. [place].  Contacts, dentures or bridgework may not be worn into surgery.                 Please read over the following fact sheets you were given: _____________________________________________________________________             [facility] - Preparing for Surgery Before surgery, you can play an important role.   Because skin is not sterile, your skin needs to be as free of germs as possible.   You can reduce the number of germs on your skin by washing with CHG (chlorahexidine gluconate) soap before surgery.   CHG is an antiseptic cleaner which kills germs and bonds with the skin to continue killing germs even after washing. Please DO NOT use if you have an allergy to CHG or antibacterial soaps.   If your skin becomes reddened/irritated stop using the CHG and inform your nurse when you arrive at Short Stay.   You may shave your face/neck.  Please follow these instructions carefully:  1.  Shower with CHG Soap the night before surgery and the  morning of Surgery.  2.  If you choose to wash your hair, wash your hair first as usual with your  normal  shampoo.  3.  After you shampoo, rinse your hair and body thoroughly to remove the  shampoo.                                        4.  Use CHG as you would any other liquid soap.  You can apply chg directly  to the skin and wash                       Gently with a scrungie or clean washcloth.  5.  Apply the CHG Soap to your body ONLY FROM THE NECK DOWN.   Do not use on face/ open                           Wound or open sores. Avoid contact with eyes, ears mouth and genitals  (private parts).                       Wash face,  Genitals (private parts) with your normal soap.             6.  Wash thoroughly, paying special attention to the area where your surgery  will be performed.  7.  Thoroughly rinse your body with warm water from the neck down.  8.  DO NOT shower/wash with your normal soap after using and rinsing off  the CHG Soap.             9.  Pat yourself dry with a clean towel.  10.  Wear clean pajamas.            11.  Place clean sheets on your bed the night of your first shower and do not  sleep with pets. Day of Surgery : Do not apply any lotions/deodorants the morning of surgery.  Please wear clean clothes to the hospital/surgery center.  FAILURE TO FOLLOW THESE INSTRUCTIONS MAY RESULT IN THE CANCELLATION OF YOUR SURGERY PATIENT SIGNATURE_________________________________  NURSE SIGNATURE__________________________________  ________________________________________________________________________   Gabriel Mcdonald  An incentive spirometer is a tool that can help keep your lungs clear and active. This tool measures how well you are filling your lungs with each breath. Taking long deep breaths may help reverse or decrease the chance of developing breathing (pulmonary) problems (especially infection) following:  A long period of time when you are unable to move or be active. BEFORE THE PROCEDURE   If the spirometer includes an indicator to show your best effort, your nurse or respiratory therapist will set it to a desired goal.  If possible, sit up straight or lean slightly forward. Try not to slouch.  Hold the incentive spirometer in an upright position. INSTRUCTIONS FOR USE  1. Sit on the edge of your bed if possible, or sit up as far as you can in bed or on a chair. 2. Hold the incentive spirometer in an upright position. 3. Breathe out normally. 4. Place the mouthpiece in your mouth and seal your lips tightly around  it. 5. Breathe in slowly and as deeply as possible, raising the piston or the ball toward the top of the column. 6. Hold your breath for 3-5 seconds or for as long as possible. Allow the piston or ball to fall to the bottom of the column. 7. Remove the mouthpiece from your mouth and breathe out normally. 8. Rest for a few seconds and repeat Steps 1 through 7 at least 10 times every 1-2 hours when you are awake. Take your time and take a few normal breaths between deep breaths. 9. The spirometer may include an indicator to show your best effort. Use the indicator as a goal to work toward during each repetition. 10. After each set of 10 deep breaths, practice coughing to be sure your lungs are clear. If you have an incision (the cut made at the time of surgery), support your incision when coughing by placing a pillow or rolled up towels firmly against it. Once you are able to get out of bed, walk around indoors and cough well. You may stop using the incentive spirometer when instructed by your caregiver.  RISKS AND COMPLICATIONS  Take your time so you do not get dizzy or light-headed.  If you are in pain, you may need to take or ask for pain medication before doing incentive spirometry. It is harder to take a deep breath if you are having pain. AFTER USE  Rest and breathe slowly and easily.  It can be helpful to keep track of a log of your progress. Your caregiver can provide you with a simple table to help with this. If you are using the spirometer at home, follow these instructions: [place] IF:   You are having difficultly using the spirometer.  You have trouble using the spirometer as often as instructed.  Your pain medication is not giving enough relief while using the spirometer.  You develop fever of 100.5 F (38.1 C) or higher. SEEK IMMEDIATE MEDICAL CARE IF:   You cough up bloody sputum  that had not been present before.  You develop fever of 102 F (38.9 C) or  greater.  You develop worsening pain at or near the incision site. MAKE SURE YOU:   Understand these instructions.  Will watch your condition.  Will get help right away if you are not doing well or get worse. Document Released: 09/22/2006 Document Revised: 08/04/2011 Document Reviewed: 11/23/2006 [facility] Patient Information 2014 [place], [place].   ________________________________________________________________________

## 2020-05-08 ENCOUNTER — Other Ambulatory Visit: Payer: Self-pay

## 2020-05-08 ENCOUNTER — Encounter (HOSPITAL_COMMUNITY): Payer: Self-pay

## 2020-05-08 ENCOUNTER — Encounter (HOSPITAL_COMMUNITY)
Admission: RE | Admit: 2020-05-08 | Discharge: 2020-05-08 | Disposition: A | Discharge status: Home / Self Care | Payer: Medicare Other | Source: Ambulatory Visit | Attending: Orthopedic Surgery | Admitting: Orthopedic Surgery

## 2020-05-08 DIAGNOSIS — Z01818 Encounter for other preprocedural examination: Secondary | ICD-10-CM | POA: Diagnosis not present

## 2020-05-08 DIAGNOSIS — I447 Left bundle-branch block, unspecified: Secondary | ICD-10-CM | POA: Diagnosis not present

## 2020-05-08 DIAGNOSIS — I440 Atrioventricular block, first degree: Secondary | ICD-10-CM | POA: Insufficient documentation

## 2020-05-08 LAB — COMPREHENSIVE METABOLIC PANEL
ALT: 12 U/L (ref 0–44)
AST: 15 U/L (ref 15–41)
Albumin: 3.8 g/dL (ref 3.5–5.0)
Alkaline Phosphatase: 67 U/L (ref 38–126)
Anion gap: 9 (ref 5–15)
BUN: 20 mg/dL (ref 8–23)
CO2: 24 mmol/L (ref 22–32)
Calcium: 9.6 mg/dL (ref 8.9–10.3)
Chloride: 105 mmol/L (ref 98–111)
Creatinine, Ser: 1.02 mg/dL (ref 0.61–1.24)
GFR, Estimated: >60 mL/min (ref >60)
Glucose, Bld: 95 mg/dL (ref 70–99)
Potassium: 4.2 mmol/L (ref 3.5–5.1)
Sodium: 138 mmol/L (ref 135–145)
Total Bilirubin: 0.6 mg/dL (ref 0.3–1.2)
Total Protein: 6.3 g/dL — ABNORMAL LOW (ref 6.5–8.1)

## 2020-05-08 LAB — SURGICAL PCR SCREEN
MRSA, PCR: NEGATIVE
Staphylococcus aureus: POSITIVE — AB

## 2020-05-08 LAB — CBC
HCT: 33.7 % — ABNORMAL LOW (ref 39.0–52.0)
Hemoglobin: 11.6 g/dL — ABNORMAL LOW (ref 13.0–17.0)
MCH: 32 pg (ref 26.0–34.0)
MCHC: 34.4 g/dL (ref 30.0–36.0)
MCV: 92.8 fL (ref 80.0–100.0)
Platelets: 209 10*3/uL (ref 150–400)
RBC: 3.63 MIL/uL — ABNORMAL LOW (ref 4.22–5.81)
RDW: 13.1 % (ref 11.5–15.5)
WBC: 4.9 10*3/uL (ref 4.0–10.5)
nRBC: 0 % (ref 0.0–0.2)

## 2020-05-08 LAB — APTT: aPTT: 32 seconds (ref 24–36)

## 2020-05-08 LAB — PROTIME-INR
INR: 1 (ref 0.8–1.2)
Prothrombin Time: 12.8 seconds (ref 11.4–15.2)

## 2020-05-08 NOTE — Progress Notes (Signed)
COVID Vaccine Completed:No Date COVID Vaccine completed: COVID vaccine manufacturer: Pfizer    Moderna   [employer]   PCP - Dr. Sherrie Sport Cardiologist - no  Chest x-ray - no EKG - 05/08/20 Stress Test -no  ECHO - no Cardiac Cath -2011-stents, CABG-1991 Pacemaker/ICD device last checked:NA  Sleep Study - no CPAP -   Fasting Blood Sugar - NA Checks Blood Sugar _____ times a day  Blood Thinner Instructions:ASA/ Hasanaj Aspirin Instructions:contiue to take it/Aluisio Last Dose:  Anesthesia review:   Patient denies shortness of breath, fever, cough and chest pain at PAT appointmentyes  Patient verbalized understanding of instructions that were given to them at the PAT appointment. Patient was also instructed that they will need to review over the PAT instructions again at home before surgery. Yes Pt has COPD from his work environment and is SOB with activity but not ADLs.

## 2020-05-10 ENCOUNTER — Other Ambulatory Visit (HOSPITAL_COMMUNITY)
Admission: RE | Admit: 2020-05-10 | Discharge: 2020-05-10 | Disposition: A | Discharge status: Home / Self Care | Payer: Medicare Other | Source: Ambulatory Visit | Attending: Orthopedic Surgery | Admitting: Orthopedic Surgery

## 2020-05-10 DIAGNOSIS — Z20822 Contact with and (suspected) exposure to covid-19: Secondary | ICD-10-CM | POA: Insufficient documentation

## 2020-05-10 DIAGNOSIS — Z01812 Encounter for preprocedural laboratory examination: Secondary | ICD-10-CM | POA: Diagnosis not present

## 2020-05-10 LAB — SARS CORONAVIRUS 2 (TAT 6-24 HRS): SARS Coronavirus 2: NEGATIVE

## 2020-05-11 ENCOUNTER — Ambulatory Visit (HOSPITAL_COMMUNITY): Payer: Medicare Other | Admitting: Physician Assistant

## 2020-05-11 ENCOUNTER — Ambulatory Visit (HOSPITAL_COMMUNITY): Payer: Medicare Other | Admitting: Anesthesiology

## 2020-05-11 NOTE — H&P (Signed)
TOTAL KNEE ADMISSION H&P  Patient is being admitted for left total knee arthroplasty.  Subjective:  Chief Complaint: Left knee pain.  HPI: Gabriel Mcdonald, 86 y.o. male has a history of pain and functional disability in the left knee due to arthritis and has failed non-surgical conservative treatments for greater than 12 weeks to include corticosteriod injections, viscosupplementation injections and activity modification. Onset of symptoms was gradual, starting >10 years ago with gradually worsening course since that time. The patient noted no past surgery on the left knee.  Patient currently rates pain in the left knee at 7 out of 10 with activity. Patient has worsening of pain with activity and weight bearing, pain that interferes with activities of daily living and crepitus. Patient has evidence of bone-on-bone arthritis in the medial and patellofemoral compartments of the left knee by imaging studies. There is no active infection.  Patient Active Problem List   Diagnosis Date Noted  . Renal calculus, left 05/29/2015  . Lumbar herniated disc 05/03/2013    Past Medical History:  Diagnosis Date  . Cancer [facility])    prostate  . COPD (chronic obstructive pulmonary disease) (HCC)    Pleural thickening per pt, pt reports exposure to asbestos  . Coronary artery disease   . GERD (gastroesophageal reflux disease)    mild  . High cholesterol   . Hypertension   . Shortness of breath     Past Surgical History:  Procedure Laterality Date  . CATARACT EXTRACTION W/PHACO  04/29/2012   Procedure: CATARACT EXTRACTION PHACO AND INTRAOCULAR LENS PLACEMENT (IOC);  Surgeon: Tonny Branch, MD;  Location: [facility];  Service: Ophthalmology;  Laterality: Right;  CDE:10.09  . CATARACT EXTRACTION W/PHACO  06/07/2012   Procedure: CATARACT EXTRACTION PHACO AND INTRAOCULAR LENS PLACEMENT (IOC);  Surgeon: Tonny Branch, MD;  Location: [facility];  Service: Ophthalmology;  Laterality: Left;  CDE:16.19  . CORONARY ANGIOPLASTY   2011   stents  . [place]    . LUMBAR LAMINECTOMY/DECOMPRESSION MICRODISCECTOMY Right 05/03/2013   Procedure: RIGHT LUMBAR DISCECTOMY LUMBAR FOUR-FIVE;  Surgeon: Floyce Stakes, MD;  Location: [facility];  Service: Neurosurgery;  Laterality: Right;  right   . Pylonodial cyst removal     from tip of spine  . TONSILLECTOMY      Prior to Admission medications   Medication Sig Start Date End Date Taking? Authorizing Provider  albuterol (PROVENTIL HFA;VENTOLIN HFA) 108 (90 BASE) MCG/ACT inhaler Inhale 2 puffs into the lungs every 6 (six) hours as needed. Shortness of Breath   Yes [provider]  amLODipine (NORVASC) 5 MG tablet Take 5 mg by mouth daily.    Yes [provider]  aspirin 81 MG tablet Take 81 mg by mouth every evening.    Yes [provider]  atorvastatin (LIPITOR) 10 MG tablet Take 10 mg by mouth every evening.    Yes [provider]  Cholecalciferol (VITAMIN D-3) 5000 UNITS TABS Take 5,000 Units by mouth daily.   Yes [provider]  lisinopril (ZESTRIL) 20 MG tablet Take 20 mg by mouth daily.    Yes [provider]  LORazepam (ATIVAN) 1 MG tablet Take 0.5 mg by mouth daily as needed for anxiety.    Yes [provider]  metoprolol succinate (TOPROL-XL) 50 MG 24 hr tablet Take 50 mg by mouth every other day. Take with or immediately following a meal.   Yes [provider]  Omega-3 Fatty Acids (FISH OIL) 1200 MG CAPS Take 1,200 mg by mouth in the morning and at bedtime.   Yes [provider]  omeprazole (PRILOSEC) 20 MG capsule Take 20 mg by mouth daily.   Yes [provider]  Soft Lens Products (REFRESH CONTACTS DROPS) SOLN Place 1 drop into both eyes daily as needed (dry eyes).    Yes [provider]  tamsulosin (FLOMAX) 0.4 MG CAPS capsule Take 0.4 mg by mouth every evening.  01/19/17  Yes [provider]   theophylline (THEODUR) 200 MG 12 hr tablet Take 400 mg by mouth every morning.    Yes [provider]  tiZANidine (ZANAFLEX) 4 MG tablet Take 4 mg by mouth every 6 (six) hours as needed for muscle spasms.   Yes [provider]  traMADol (ULTRAM) 50 MG tablet Take 50 mg by mouth every 6 (six) hours as needed for moderate pain.  12/12/16  Yes [provider]    Allergies  Allergen Reactions  . Morphine And Related     Makes pt sick on his stomach     Social History   Socioeconomic History  . Marital status: Widowed    Spouse name: Not on file  . Number of children: Not on file  . Years of education: Not on file  . Highest education level: Not on file  Occupational History  . Not on file  Tobacco Use  . Smoking status: Never Smoker  . Smokeless tobacco: Never Used  Vaping Use  . Vaping Use: Never used  Substance and Sexual Activity  . Alcohol use: No  . Drug use: No  . Sexual activity: Not on file  Other Topics Concern  . Not on file  Social History Narrative  . Not on file   Social Determinants of Health   Financial Resource Strain: Not on file  Food Insecurity: Not on file  Transportation Needs: Not on file  Physical Activity: Not on file  Stress: Not on file  Social Connections: Not on file  Intimate Partner Violence: Not on file    Tobacco Use: Low Risk   . Smoking Tobacco Use: Never Smoker  . Smokeless Tobacco Use: Never Used   Social History   Substance and Sexual Activity  Alcohol Use No    No family history on file.  Review of Systems  Constitutional: Negative for chills and fever.  HENT: Negative for congestion, sore throat and tinnitus.   Eyes: Negative for double vision, photophobia and pain.  Respiratory: Negative for cough, shortness of breath and wheezing.   Cardiovascular: Negative for chest pain, palpitations and orthopnea.  Gastrointestinal: Negative for heartburn, nausea and vomiting.  Genitourinary: Negative  for dysuria, frequency and urgency.  Musculoskeletal: Positive for joint pain.  Neurological: Negative for dizziness, weakness and headaches.    Objective:  Physical Exam: Well nourished and well developed.  General: Alert and oriented x3, cooperative and pleasant, no acute distress.  Head: normocephalic, atraumatic, neck supple.  Eyes: EOMI.  Respiratory: breath sounds clear in all fields, no wheezing, rales, or rhonchi. Cardiovascular: Regular rate and rhythm, no murmurs, gallops or rubs.  Abdomen: non-tender to palpation and soft, normoactive bowel sounds. Musculoskeletal:  Left Knee Exam:  Moderate effusion present. No swelling present.  The range of motion is: 5 to 115 degrees.  Positive crepitus on range of motion of the knee.  Positive medial joint line tenderness.  No lateral joint line tenderness.  The knee is stable.   Calves  soft and nontender. Motor function intact in LE. Strength 5/5 LE bilaterally. Neuro: Distal pulses 2+. Sensation to light touch intact in LE.  Imaging Review Plain radiographs demonstrate severe degenerative joint disease of the left knee. The overall alignment is neutral. The bone quality appears to be adequate for age and reported activity level.  Assessment/Plan:  End stage arthritis, left knee   The patient history, physical examination, clinical judgment of the provider and imaging studies are consistent with end stage degenerative joint disease of the left knee and total knee arthroplasty is deemed medically necessary. The treatment options including medical management, injection therapy arthroscopy and arthroplasty were discussed at length. The risks and benefits of total knee arthroplasty were presented and reviewed. The risks due to aseptic loosening, infection, stiffness, patella tracking problems, thromboembolic complications and other imponderables were discussed. The patient acknowledged the explanation, agreed to proceed with the plan  and consent was signed. Patient is being admitted for inpatient treatment for surgery, pain control, PT, OT, prophylactic antibiotics, VTE prophylaxis, progressive ambulation and ADLs and discharge planning. The patient is planning to be discharged home.   Patient's anticipated LOS is less than 2 midnights, meeting these requirements: - Lives within 1 hour of care - Has a competent adult at home to recover with post-op recover - NO history of  - Chronic pain requiring opioids  - Diabetes  - Heart failure  - Stroke  - DVT/VTE  - Cardiac arrhythmia  - Respiratory Failure/COPD  - Renal failure  - Anemia  - Advanced Liver disease  Therapy Plans: Outpatient therapy at [facility] Disposition: Home with girlfriend Planned DVT Prophylaxis: Xarelto 10 mg QD DME Needed: Gilford Rile PCP: Stoney Bang, MD (clearance received) TXA: IV Allergies: Morphine (nausea) Anesthesia Concerns: History of difficult intubation (spinal preferred) BMI: 27.1 Last HgbA1c: Not diabetic.  Pharmacy: [facility]  Other:  - History prostate CA, CAD with CABG and stent placements  - Patient was instructed on what medications to stop prior to surgery. - Follow-up visit in 2 weeks with Dr. Wynelle Link - Begin physical therapy following surgery - Pre-operative lab work as pre-surgical testing - Prescriptions will be provided in hospital at time of discharge  Theresa Duty, PA-C Orthopedic Surgery [facility] [place]

## 2020-05-13 MED ORDER — BUPIVACAINE LIPOSOME 1.3 % IJ SUSP
20.0000 mL | Freq: Once | INTRAMUSCULAR | Status: DC
  Filled 2020-05-13: qty 20

## 2020-05-14 ENCOUNTER — Other Ambulatory Visit: Payer: Self-pay

## 2020-05-14 ENCOUNTER — Ambulatory Visit (HOSPITAL_COMMUNITY)
Admission: RE | Admit: 2020-05-14 | Discharge: 2020-05-14 | Disposition: A | Discharge status: Home / Self Care | Payer: Medicare Other | Attending: Orthopedic Surgery | Admitting: Orthopedic Surgery

## 2020-05-14 ENCOUNTER — Encounter (HOSPITAL_COMMUNITY)
Admission: RE | Disposition: A | Discharge status: Home / Self Care | Payer: Self-pay | Source: Home / Self Care | Attending: Orthopedic Surgery

## 2020-05-14 ENCOUNTER — Encounter (HOSPITAL_COMMUNITY): Payer: Self-pay | Admitting: Orthopedic Surgery

## 2020-05-14 DIAGNOSIS — M179 Osteoarthritis of knee, unspecified: Secondary | ICD-10-CM | POA: Diagnosis present

## 2020-05-14 DIAGNOSIS — Z79899 Other long term (current) drug therapy: Secondary | ICD-10-CM | POA: Diagnosis not present

## 2020-05-14 DIAGNOSIS — Z7982 Long term (current) use of aspirin: Secondary | ICD-10-CM | POA: Diagnosis not present

## 2020-05-14 DIAGNOSIS — Z538 Procedure and treatment not carried out for other reasons: Secondary | ICD-10-CM | POA: Diagnosis not present

## 2020-05-14 DIAGNOSIS — Z955 Presence of coronary angioplasty implant and graft: Secondary | ICD-10-CM | POA: Insufficient documentation

## 2020-05-14 DIAGNOSIS — Z8546 Personal history of malignant neoplasm of prostate: Secondary | ICD-10-CM | POA: Insufficient documentation

## 2020-05-14 DIAGNOSIS — M1712 Unilateral primary osteoarthritis, left knee: Secondary | ICD-10-CM | POA: Insufficient documentation

## 2020-05-14 DIAGNOSIS — Z951 Presence of aortocoronary bypass graft: Secondary | ICD-10-CM | POA: Insufficient documentation

## 2020-05-14 LAB — TYPE AND SCREEN
ABO/RH(D): O POS
Antibody Screen: POSITIVE
PT AG Type: NEGATIVE
Unit division: 0
Unit division: 0

## 2020-05-14 LAB — BPAM RBC
Blood Product Expiration Date: 202201152359
Blood Product Expiration Date: 202201222359
Unit Type and Rh: 5100
Unit Type and Rh: 5100

## 2020-05-14 LAB — ABO/RH: ABO/RH(D): O POS

## 2020-05-14 SURGERY — ARTHROPLASTY, KNEE, TOTAL
Anesthesia: Choice | Site: Knee | Laterality: Left

## 2020-05-14 MED ORDER — FENTANYL CITRATE (PF) 100 MCG/2ML IJ SOLN
50.0000 ug | INTRAMUSCULAR | Status: DC
  Filled 2020-05-14: qty 2

## 2020-05-14 MED ORDER — POVIDONE-IODINE 10 % EX SWAB
2.0000 "application " | Freq: Once | CUTANEOUS | Status: AC
  Administered 2020-05-14: 2 via TOPICAL

## 2020-05-14 MED ORDER — LACTATED RINGERS IV SOLN: INTRAVENOUS | Status: DC

## 2020-05-14 MED ORDER — PROPOFOL 1000 MG/100ML IV EMUL
INTRAVENOUS | Status: AC
  Filled 2020-05-14: qty 200

## 2020-05-14 MED ORDER — CEFAZOLIN SODIUM-DEXTROSE 2-4 GM/100ML-% IV SOLN
2.0000 g | INTRAVENOUS | Status: DC
  Filled 2020-05-14: qty 100

## 2020-05-14 MED ORDER — DEXAMETHASONE SODIUM PHOSPHATE 10 MG/ML IJ SOLN: 8.0000 mg | Freq: Once | INTRAMUSCULAR | Status: DC

## 2020-05-14 MED ORDER — TRANEXAMIC ACID-NACL 1000-0.7 MG/100ML-% IV SOLN
1000.0000 mg | INTRAVENOUS | Status: DC
  Filled 2020-05-14: qty 100

## 2020-05-14 MED ORDER — ORAL CARE MOUTH RINSE: 15.0000 mL | Freq: Once | OROMUCOSAL | Status: AC

## 2020-05-14 MED ORDER — ACETAMINOPHEN 10 MG/ML IV SOLN
1000.0000 mg | Freq: Four times a day (QID) | INTRAVENOUS | Status: DC
  Filled 2020-05-14: qty 100

## 2020-05-14 MED ORDER — CHLORHEXIDINE GLUCONATE 0.12 % MT SOLN
15.0000 mL | Freq: Once | OROMUCOSAL | Status: AC
  Administered 2020-05-14: 12:00:00 15 mL via OROMUCOSAL

## 2020-05-14 NOTE — Progress Notes (Signed)
Pt has extensive cardiac history. No cardiac clearance in chart. This nurse called [facility] hospital cardiologist, Dr. Sabra Heck, and he states he doesn't feel comfortable giving clearance over the phone. In addition, he reports pt had positive stress test and angina in Oct 2021 appt. This message has been relayed between Dr. Wynelle Link, Dr. Lissa Hoard, and Dr. Sabra Heck. Plan is to cancel procedure here today and get cardiac clearance.

## 2020-05-14 NOTE — Interval H&P Note (Signed)
History and Physical Interval Note:  05/14/2020 1:04 PM  Gabriel Mcdonald  has presented today for surgery, with the diagnosis of left knee osteoarthritis.  The various methods of treatment have been discussed with the patient and family. After consideration of risks, benefits and other options for treatment, the patient has consented to  Procedure(s) with comments: TOTAL KNEE ARTHROPLASTY (Left) - 13min as a surgical intervention.  The patient's history has been reviewed, patient examined, no change in status, stable for surgery.  I have reviewed the patient's chart and labs.  Questions were answered to the patient's satisfaction.     Pilar Plate [name]

## 2020-05-14 NOTE — Anesthesia Preprocedure Evaluation (Addendum)
Anesthesia Evaluation    Reviewed: Allergy & Precautions, H&P , Patient's Chart, lab work & pertinent test results, Unable to perform ROS - Chart review only  Airway        Dental   Pulmonary COPD,           Cardiovascular Exercise Tolerance: Good hypertension, + CAD, + Cardiac Stents and + CABG       Neuro/Psych    GI/Hepatic GERD  ,  Endo/Other    Renal/GU Renal disease     Musculoskeletal   Abdominal   Peds  Hematology   Anesthesia Other Findings   Reproductive/Obstetrics                             Anesthesia Physical  Anesthesia Plan  ASA: III  Anesthesia Plan:    Post-op Pain Management:  [facility] for Post-op pain   Induction: Intravenous  PONV Risk Score and Plan:   Airway Management Planned:   Additional Equipment:   Intra-op Plan:   Post-operative Plan:   Informed Consent:   Plan Discussed with:   Anesthesia Plan Comments: (Pt. Apparently told PAT he did not see a cardiologist. However, given his history of CABG and then stents, he should be seeing a cardiologist regularly. I spoke to the patient and he said he did see one within the last year, but it was at the [place]. I requested that the preop staff obtain this note. They were able to speak with an RN in the [place] cardiology clinic who spoke with Mr. Stegeman's cardiologist, Dr. Sabra Heck (I was unable to speak with him directly). Dr. Sabra Heck communicated that he would not do a preop "clearance" over the phone, but that he would consider him "high risk" given his angina and positive stress in the past. I discussed with Dr. Wynelle Link and he was obviously concerned and I felt that he should be cancelled and proper cardiology evaluation should be obtained. )      Anesthesia Quick Evaluation

## 2020-05-14 NOTE — Care Plan (Signed)
Ortho Bundle Case Management Note  Patient Details  Name: Gabriel Mcdonald MRN: [id number] Date of Birth: 1931/07/09  L TKA on 05-14-20 DCP:  Home with girlfriend.  2 story home with 2 ste. DME:  RW ordered through [employer].  Has a 3-in-1. PT:  [facility].  PT eval scheduled on 05-17-20.                   DME Arranged:  Gilford Rile rolling DME Agency:  [employer]  HH Arranged:  NA [place] Agency:  NA  Additional Comments: Please contact me with any questions of if this plan should need to change.  Marianne Sofia, RN,CCM [facility]  [phone] 05/14/2020, 2:28 PM

## 2020-05-16 DIAGNOSIS — S65311A Laceration of deep palmar arch of right hand, initial encounter: Secondary | ICD-10-CM | POA: Diagnosis not present

## 2020-05-18 LAB — BPAM RBC
Blood Product Expiration Date: 202201152359
Blood Product Expiration Date: 202201222359
Unit Type and Rh: 5100
Unit Type and Rh: 5100

## 2020-05-18 LAB — TYPE AND SCREEN
ABO/RH(D): O POS
Antibody Screen: POSITIVE
Donor AG Type: NEGATIVE
Donor AG Type: NEGATIVE
Unit division: 0
Unit division: 0

## 2020-05-24 DIAGNOSIS — S65311A Laceration of deep palmar arch of right hand, initial encounter: Secondary | ICD-10-CM | POA: Diagnosis not present

## 2020-05-24 DIAGNOSIS — D649 Anemia, unspecified: Secondary | ICD-10-CM | POA: Diagnosis not present

## 2020-05-24 DIAGNOSIS — Z79899 Other long term (current) drug therapy: Secondary | ICD-10-CM | POA: Diagnosis not present

## 2020-06-11 DIAGNOSIS — Z23 Encounter for immunization: Secondary | ICD-10-CM | POA: Diagnosis not present

## 2020-06-14 DIAGNOSIS — D1730 Benign lipomatous neoplasm of skin and subcutaneous tissue of unspecified sites: Secondary | ICD-10-CM | POA: Diagnosis not present

## 2020-06-14 DIAGNOSIS — Z09 Encounter for follow-up examination after completed treatment for conditions other than malignant neoplasm: Secondary | ICD-10-CM | POA: Diagnosis not present

## 2020-06-14 DIAGNOSIS — H57813 Brow ptosis, bilateral: Secondary | ICD-10-CM | POA: Diagnosis not present

## 2020-06-14 DIAGNOSIS — Z961 Presence of intraocular lens: Secondary | ICD-10-CM | POA: Diagnosis not present

## 2020-07-25 ENCOUNTER — Ambulatory Visit (INDEPENDENT_AMBULATORY_CARE_PROVIDER_SITE_OTHER): Payer: Medicare Other | Admitting: Nurse Practitioner

## 2020-07-25 ENCOUNTER — Encounter: Payer: Self-pay | Admitting: Nurse Practitioner

## 2020-07-25 ENCOUNTER — Other Ambulatory Visit: Payer: Self-pay

## 2020-07-25 VITALS — BP 104/58 | HR 52 | Ht 72.0 in | Wt 194.0 lb

## 2020-07-25 DIAGNOSIS — R110 Nausea: Secondary | ICD-10-CM

## 2020-07-25 DIAGNOSIS — D649 Anemia, unspecified: Secondary | ICD-10-CM | POA: Diagnosis not present

## 2020-07-25 DIAGNOSIS — R1310 Dysphagia, unspecified: Secondary | ICD-10-CM

## 2020-07-25 NOTE — Progress Notes (Signed)
ASSESSMENT AND PLAN    # 87 yo male with dysphagia, mainly to solids, over last few months.  Could be esophageal dysmotility.  Rule out esophageal stricture. Also with frequent nausea and new anemia --For further evaluation patient will be scheduled for EGD with esophageal dilation if appropriate. The risks and benefits of EGD were discussed and the patient agrees to proceed.  --Advised patient to eat small bites, chew well with liquids in between bites to avoid food impaction.   # [place] anemia. Hgb was 11.6 in December. He has since been on oral iron.  No overt GI bleeding.  --Further evaluation at time of EGD. He is up to date on colon cancer screening ( 2018).   # CAD / remote CABG. Had a routine visit with Cardiology at [facility] within last couple of months. Apparently no acute cardiac issues, he was cleared for knee surgery in April. Marland Kitchen   HISTORY OF PRESENT ILLNESS     Primary Gastroenterologist :Wilfrid Lund, MD  Chief Complaint : swallowing problems.   Gabriel Mcdonald is a 87 y.o. male with PMH / [place] significant for adenomatous colon polyps, GERD, COPD, HTN  Patient last evaluated in 2018 for constipation, indigestion.  Mr. Mcdonald is here with complaints of intermittent swallowing problems. PIlls and solid food frequently feel like they get stuck in his esophagus. Occasionally the problems occurs with liquids as well. Patient feels like he has to chew food excessively to prevent it getting stuck. Symptoms started 4-5 months ago. He has lost about 6 pounds since mid December but cannot directly correlated it to swallowing problems. He gives a history of GERD, asymptomatic on Prilosec    Additionally, patient has been having frequent episodes of nausea without vomiting .  The nausea is random,  not necessarily related to eating.  He did start iron in December, no other medication changes.   Patient was found to be mildly anemic with a hemoglobin of 11.6 in December.  No recent labs to  compare, hemoglobin was 15 in 2014 He has been taking oral iron since December .  No overt bleeding/black stools .  Prior to being diagnosed with anemia in December patient had not taken any NSAIDs for several months (except for a daily baby aspirin).    Patient is scheduled for knee surgery in April  Data Reviewed:  05/08/20  Hgb 11.6  Previous Endoscopic Evaluations / Pertinent Studies:  Oct 2018 colonoscopy for constipation -BPPS 6 -- A 2 mm polyp sessile hepatic flexure polyp -- Many diverticula were found in the left colon, with associated tortuosity. - There was evidence of a prior hemorrhoidectomy at anal verge.  Path = tubular adenoma. No recall needed  Past Medical History:  Diagnosis Date  . Cancer [facility])    prostate  . COPD (chronic obstructive pulmonary disease) (HCC)    Pleural thickening per pt, pt reports exposure to asbestos  . Coronary artery disease   . GERD (gastroesophageal reflux disease)    mild  . High cholesterol   . Hypertension      Past Surgical History:  Procedure Laterality Date  . CATARACT EXTRACTION W/PHACO  04/29/2012   Procedure: CATARACT EXTRACTION PHACO AND INTRAOCULAR LENS PLACEMENT (IOC);  Surgeon: Tonny Branch, MD;  Location: [facility];  Service: Ophthalmology;  Laterality: Right;  CDE:10.09  . CATARACT EXTRACTION W/PHACO  06/07/2012   Procedure: CATARACT EXTRACTION PHACO AND INTRAOCULAR LENS PLACEMENT (IOC);  Surgeon: Tonny Branch, MD;  Location: [facility];  Service: Ophthalmology;  Laterality: Left;  CDE:16.19  . CORONARY ANGIOPLASTY  2011   stents  . [place]    . LUMBAR LAMINECTOMY/DECOMPRESSION MICRODISCECTOMY Right 05/03/2013   Procedure: RIGHT LUMBAR DISCECTOMY LUMBAR FOUR-FIVE;  Surgeon: Floyce Stakes, MD;  Location: [facility];  Service: Neurosurgery;  Laterality: Right;  right   . Pylonodial cyst removal     from tip of spine  . TONSILLECTOMY     Family History   Problem Relation Age of Onset  . Colon cancer Neg Hx   . Stomach cancer Neg Hx   . Pancreatic cancer Neg Hx   . Esophageal cancer Neg Hx   . Liver disease Neg Hx    Social History   Tobacco Use  . Smoking status: Never Smoker  . Smokeless tobacco: Never Used  Vaping Use  . Vaping Use: Never used  Substance Use Topics  . Alcohol use: No  . Drug use: No   Current Outpatient Medications  Medication Sig Dispense Refill  . albuterol (PROVENTIL HFA;VENTOLIN HFA) 108 (90 BASE) MCG/ACT inhaler Inhale 2 puffs into the lungs every 6 (six) hours as needed. Shortness of Breath    . amLODipine (NORVASC) 5 MG tablet Take 5 mg by mouth daily.     Marland Kitchen aspirin 81 MG tablet Take 81 mg by mouth every evening.     Marland Kitchen atorvastatin (LIPITOR) 10 MG tablet Take 10 mg by mouth every evening.     . Cholecalciferol (VITAMIN D-3) 5000 UNITS TABS Take 5,000 Units by mouth daily.    . FEROSUL 325 (65 Fe) MG tablet Take 325 mg by mouth 2 (two) times daily.    . folic acid (FOLVITE) 1 MG tablet Take 1 mg by mouth daily.    Marland Kitchen lisinopril (ZESTRIL) 20 MG tablet Take 20 mg by mouth daily.     Marland Kitchen LORazepam (ATIVAN) 1 MG tablet Take 0.5 mg by mouth daily as needed for anxiety.     . metoprolol succinate (TOPROL-XL) 50 MG 24 hr tablet Take 50 mg by mouth every other day. Take with or immediately following a meal.    . Omega-3 Fatty Acids (FISH OIL) 1200 MG CAPS Take 1,200 mg by mouth in the morning and at bedtime.    Marland Kitchen omeprazole (PRILOSEC) 20 MG capsule Take 20 mg by mouth daily.    . polyethylene glycol powder (GLYCOLAX/MIRALAX) 17 GM/SCOOP powder Take 17 g by mouth daily.    . Soft Lens Products (REFRESH CONTACTS DROPS) SOLN Place 1 drop into both eyes daily as needed (dry eyes).     . tamsulosin (FLOMAX) 0.4 MG CAPS capsule Take 0.4 mg by mouth every evening.     . theophylline (THEODUR) 200 MG 12 hr tablet Take 400 mg by mouth every morning.     Marland Kitchen tiZANidine (ZANAFLEX) 4 MG tablet Take 4 mg by mouth every 6 (six)  hours as needed for muscle spasms.    . traMADol (ULTRAM) 50 MG tablet Take 50 mg by mouth every 6 (six) hours as needed for moderate pain.   1  . vitamin B-12 (CYANOCOBALAMIN) 1000 MCG tablet Take 1,000 mcg by mouth every other day.     Current Facility-Administered Medications  Medication Dose Route Frequency Provider Last Rate Last Admin  . 0.9 %  sodium chloride infusion  500 mL Intravenous Continuous Doran Stabler, MD       Allergies  Allergen  Reactions  . Morphine And Related     Makes pt sick on his stomach      Review of Systems:  All other systems reviewed and negative except where noted in HPI.   PHYSICAL EXAM :    Wt Readings from Last 3 Encounters:  07/25/20 194 lb (88 kg)  05/14/20 200 lb 9.9 oz (91 kg)  05/08/20 201 lb (91.2 kg)    Ht 6' (1.829 m)   Wt 194 lb (88 kg)   BMI 26.31 kg/m  Constitutional:  Pleasant male in no acute distress. Psychiatric: Normal mood and affect. Behavior is normal. EENT: Pupils normal.  Conjunctivae are normal. No scleral icterus. Neck supple.  Cardiovascular: Normal rate, regular rhythm. No edema Pulmonary/chest: Effort normal and breath sounds normal. No wheezing, rales or rhonchi. Abdominal: Soft, nondistended, nontender. Bowel sounds active throughout. There are no masses palpable. No hepatomegaly. Neurological: Alert and oriented to person place and time. Skin: Skin is warm and dry. No rashes noted.  Tye Savoy, NP  07/25/2020, 10:03 AM

## 2020-07-25 NOTE — Progress Notes (Signed)
____________________________________________________________  Attending physician addendum:  Thank you for sending this case to me. I have reviewed the entire note and agree with the plan.  He is on my schedule for the day after tomorrow. I also forwarded his chart to anesthesia for review since it was a short notice add-on.  Wilfrid Lund, MD  ____________________________________________________________

## 2020-07-25 NOTE — Patient Instructions (Addendum)
If you are age 85 or older, your body mass index should be between 23-30. Your Body mass index is 26.31 kg/m. If this is out of the aforementioned range listed, please consider follow up with your Primary Care Provider.  PROCEDURES: You have been scheduled for a endoscopy. Please follow the written instructions given to you at your visit today. If you use inhalers (even only as needed), please bring them with you on the day of your procedure.   It was great seeing you today! Thank you for entrusting me with your care and choosing [facility]  Tye Savoy, NP

## 2020-07-26 ENCOUNTER — Telehealth: Payer: Self-pay | Admitting: Gastroenterology

## 2020-07-26 ENCOUNTER — Telehealth: Payer: Self-pay | Admitting: Nurse Practitioner

## 2020-07-26 DIAGNOSIS — R110 Nausea: Secondary | ICD-10-CM

## 2020-07-26 DIAGNOSIS — R1310 Dysphagia, unspecified: Secondary | ICD-10-CM

## 2020-07-26 NOTE — Telephone Encounter (Signed)
Pt's spouse is wanting to inform the nurse that the pt's xray is scheduled at [facility] on 3/8 @9 :00am

## 2020-07-26 NOTE — Telephone Encounter (Signed)
Gabriel Mcdonald,  This patient was seen in clinic yesterday for dysphagia and scheduled for upper endoscopy with me in the [facility] tomorrow, March 4.  I forwarded the case to Osvaldo Angst, CRNA for review due to the patient's cardiac history.  He performed a chart review and discovered that this patient had a knee surgery canceled last December because of anesthesia concerns related to this patient's cardiac history.  An excerpt from that note is below:  "Anesthesia Plan Comments: (Pt. Apparently told PAT he did not see a cardiologist. However, given his history of CABG and then stents, he should be seeing a cardiologist regularly. I spoke to the patient and he said he did see one within the last year, but it was at the [place]. I requested that the preop staff obtain this note. They were able to speak with an RN in the [place] cardiology clinic who spoke with Gabriel Mcdonald's cardiologist, Gabriel Mcdonald (I was unable to speak with him directly). Gabriel Mcdonald communicated that he would not do a preop "clearance" over the phone, but that he would consider him "high risk" given his angina and positive stress in the past. I discussed with Gabriel Mcdonald and he was obviously concerned and I felt that he should be cancelled and proper cardiology evaluation should be obtained. )  "  ____________________  As such, this patient's EGD tomorrow must be canceled.  Please contact the patient this morning to inform him of that.  To work-up his upper GI symptoms, please schedule an upper GI x-ray series (no small bowel follow-through).  Also, please find out from the patient if he ever saw his cardiologist at the [facility] for preoperative clearance.  If so, we need those records.  If not, then he needs to see his [place] cardiologist and arrangements made to get those records forwarded to [place].  - H. Danis  ________  Gabriel Mcdonald

## 2020-07-26 NOTE — Telephone Encounter (Signed)
See alternate phone note  

## 2020-07-26 NOTE — Telephone Encounter (Signed)
Lm on vm for Butch Penny to return call to discuss further information from Dr. Loletha Carrow.

## 2020-07-26 NOTE — Telephone Encounter (Addendum)
EGD cancelled on 07/27/20 with Dr. Loletha Carrow.  Lm on mobile vm for patient to return call.  Patient has been scheduled for an upper GI series at [facility] on Thursday, 08/02/20 at  10:30 AM, arrive at 10:15 AM. NPO after midnight.

## 2020-07-26 NOTE — Telephone Encounter (Signed)
Taylor,Donna [phone]  Michel Harrow Y 13 minutes ago (1:39 PM)   Smitty Knudsen returning missed call from a nurse.Marland Kitchen Please advise   Incoming call     Spoke with Butch Penny in regards to Dr. Loletha Carrow' recommendations. Butch Penny states that patient saw Dr. Sabra Heck cardiologist at the [facility] in [place] on 07/06/20 and had been cleared for his knee surgery and EGD. Advised that I will have to send a request for the medical records stating that patient is cleared for the endoscopy. Advised that until we have the clearance the next step would be the upper GI series. Butch Penny verbalized understanding and had no concerns at the end of the call. Left a message for cardiology charge nurse to fax latest office note from Dr. Sabra Heck to Dr. Loletha Carrow' attention. Advised nurse to give me a call back if she has any further questions.   [place] - [phone], EXT. [place] [facility]

## 2020-07-26 NOTE — Telephone Encounter (Signed)
Patient's partner, Butch Penny called back and was informed of procedure being cancelled for 3/4 and scheduled at [facility] for 3/10 with NPO after midnight.

## 2020-07-26 NOTE — Telephone Encounter (Signed)
noted 

## 2020-07-27 ENCOUNTER — Encounter: Payer: Medicare Other | Admitting: Gastroenterology

## 2020-07-27 NOTE — Telephone Encounter (Signed)
[place],  Thank you for obtaining that cardiology note, it was very helpful.  It indicates the patient is stated to be at low cardiovascular risk for anesthesia.  Given that, I believe we will most likely be able to return to the original plan of an upper endoscopy with me in the [facility].  We can therefore canceled the upper GI series.  Before the procedure is rescheduled, we just need our own anesthesia staff to know this and review his chart again and approve the patient for the [place].   ________________________  Jenny Reichmann,  Please see the attached message stream on this patient we discussed earlier in the week.  I have left that note from the patient's [place] cardiologist on my office desk for you to review if necessary.  (I have not yet sent to be scanned)  - H. Danis

## 2020-07-27 NOTE — Telephone Encounter (Signed)
Dr. Loletha Carrow, we received the latest cardiology note for patient. It has been placed in your IN box for review.

## 2020-07-29 NOTE — Telephone Encounter (Signed)
Dr. Danis,  This pt is cleared for anesthetic care at [facility].   Thanks,  Ellieana Dolecki 

## 2020-07-30 NOTE — Telephone Encounter (Signed)
Lm on vm for Butch Penny to return call.

## 2020-07-30 NOTE — Telephone Encounter (Signed)
Spoke with Gabriel Mcdonald, upper GI series has been cancelled. Patient is scheduled for his EGD on Wednesday, 08/01/20 at 3:30 PM, arrival time 2:30 PM with a care partner. Gabriel Mcdonald is aware that I will send updated instructions via My Chart. Gabriel Mcdonald verbalized understanding and had no concerns at the end of the call.

## 2020-07-31 ENCOUNTER — Other Ambulatory Visit (HOSPITAL_COMMUNITY): Payer: Medicare Other

## 2020-08-01 ENCOUNTER — Encounter: Payer: Self-pay | Admitting: Gastroenterology

## 2020-08-01 ENCOUNTER — Other Ambulatory Visit: Payer: Self-pay

## 2020-08-01 ENCOUNTER — Ambulatory Visit (AMBULATORY_SURGERY_CENTER): Payer: Medicare Other | Admitting: Gastroenterology

## 2020-08-01 VITALS — BP 139/55 | HR 53 | Temp 97.1°F | Resp 9 | Ht 72.0 in | Wt 194.0 lb

## 2020-08-01 DIAGNOSIS — K31819 Angiodysplasia of stomach and duodenum without bleeding: Secondary | ICD-10-CM

## 2020-08-01 DIAGNOSIS — R1319 Other dysphagia: Secondary | ICD-10-CM | POA: Diagnosis not present

## 2020-08-01 DIAGNOSIS — R1310 Dysphagia, unspecified: Secondary | ICD-10-CM | POA: Diagnosis not present

## 2020-08-01 MED ORDER — SODIUM CHLORIDE 0.9 % IV SOLN: 500.0000 mL | Freq: Once | INTRAVENOUS | Status: DC

## 2020-08-01 NOTE — Progress Notes (Signed)
PT taken to PACU. Monitors in place. VSS. Report given to RN. 

## 2020-08-01 NOTE — Op Note (Signed)
[place] Patient Name: Gabriel Mcdonald Procedure Date: 08/01/2020 3:20 PM MRN: [id number] Endoscopist: Mallie Mussel L. Loletha Carrow , MD Age: 87 Referring MD:  Date of Birth: 08-05-1931 Gender: Male Account #: [id number] Procedure:                Upper GI endoscopy Indications:              Esophageal dysphagia (occurs with solids and                            liquids) Medicines:                Monitored Anesthesia Care Procedure:                Pre-Anesthesia Assessment:                           - Prior to the procedure, a History and Physical                            was performed, and patient medications and                            allergies were reviewed. The patient's tolerance of                            previous anesthesia was also reviewed. The risks                            and benefits of the procedure and the sedation                            options and risks were discussed with the patient.                            All questions were answered, and informed consent                            was obtained. Prior Anticoagulants: The patient has                            taken no previous anticoagulant or antiplatelet                            agents except for aspirin. ASA Grade Assessment:                            III - A patient with severe systemic disease. After                            reviewing the risks and benefits, the patient was                            deemed in satisfactory condition to undergo the  procedure.                           After obtaining informed consent, the endoscope was                            passed under direct vision. Throughout the                            procedure, the patient's blood pressure, pulse, and                            oxygen saturations were monitored continuously. The                            Endoscope was introduced through the mouth, and                            advanced  to the second part of duodenum. The upper                            GI endoscopy was accomplished without difficulty.                            The patient tolerated the procedure well. Scope In: Scope Out: Findings:                 There is no endoscopic evidence of Barrett's                            esophagus, esophagitis, hiatal hernia or stricture                            in the entire esophagus. No esophageal dilatation                            or resistance passing the scope through the EGJ.                           A single diminutive angioectasia with no bleeding                            was found on the greater curvature of the gastric                            body.                           The exam of the stomach was otherwise normal.                           The cardia and gastric fundus were normal on                            retroflexion.  The examined duodenum was normal. Complications:            No immediate complications. Estimated Blood Loss:     Estimated blood loss: none. Impression:               - A single non-bleeding angioectasia in the stomach.                           - Normal examined duodenum.                           - No specimens collected. Recommendation:           - Patient has a contact number available for                            emergencies. The signs and symptoms of potential                            delayed complications were discussed with the                            patient. Return to normal activities tomorrow.                            Written discharge instructions were provided to the                            patient.                           - Resume previous diet.                           - Continue present medications.                           - Recommend re-evaluation with patient's                            cardiologist to consider changing amlodipine to                             diltiazem (if clinically appropriate) for suspected                            esophageal spasm. Second choice is a long-acting                            nitrate. Gabriel Mcdonald L. Loletha Carrow, MD 08/01/2020 3:57:23 PM This report has been signed electronically.

## 2020-08-01 NOTE — Patient Instructions (Signed)
YOU HAD AN ENDOSCOPIC PROCEDURE TODAY AT [facility]:   Refer to the procedure report that was given to you for any specific questions about what was found during the examination.  If the procedure report does not answer your questions, please call your gastroenterologist to clarify.  If you requested that your care partner not be given the details of your procedure findings, then the procedure report has been included in a sealed envelope for you to review at your convenience later.  YOU SHOULD EXPECT: Some feelings of bloating in the abdomen. Passage of more gas than usual.  Walking can help get rid of the air that was put into your GI tract during the procedure and reduce the bloating. If you had a lower endoscopy (such as a colonoscopy or flexible sigmoidoscopy) you may notice spotting of blood in your stool or on the toilet paper. If you underwent a bowel prep for your procedure, you may not have a normal bowel movement for a few days.  Please Note:  You might notice some irritation and congestion in your nose or some drainage.  This is from the oxygen used during your procedure.  There is no need for concern and it should clear up in a day or so.  SYMPTOMS TO REPORT IMMEDIATELY:   Following upper endoscopy (EGD)  Vomiting of blood or coffee ground material  New chest pain or pain under the shoulder blades  Painful or persistently difficult swallowing  New shortness of breath  Fever of 100F or higher  Black, tarry-looking stools  For urgent or emergent issues, a gastroenterologist can be reached at any hour by calling [phone]. Do not use MyChart messaging for urgent concerns.    DIET:  We do recommend a small meal at first, but then you may proceed to your regular diet.  Drink plenty of fluids but you should avoid alcoholic beverages for 24 hours.  ACTIVITY:  You should plan to take it easy for the rest of today and you should NOT DRIVE or use heavy machinery  until tomorrow (because of the sedation medicines used during the test).    FOLLOW UP: Our staff will call the number listed on your records 48-72 hours following your procedure to check on you and address any questions or concerns that you may have regarding the information given to you following your procedure. If we do not reach you, we will leave a message.  We will attempt to reach you two times.  During this call, we will ask if you have developed any symptoms of COVID 19. If you develop any symptoms (ie: fever, flu-like symptoms, shortness of breath, cough etc.) before then, please call [phone].  If you test positive for Covid 19 in the 2 weeks post procedure, please call and report this information to [place].    SIGNATURES/CONFIDENTIALITY: You and/or your care partner have signed paperwork which will be entered into your electronic medical record.  These signatures attest to the fact that that the information above on your After Visit Summary has been reviewed and is understood.  Full responsibility of the confidentiality of this discharge information lies with you and/or your care-partner.

## 2020-08-02 ENCOUNTER — Ambulatory Visit (HOSPITAL_COMMUNITY): Payer: Medicare Other

## 2020-08-03 ENCOUNTER — Telehealth: Payer: Self-pay

## 2020-08-03 NOTE — Telephone Encounter (Signed)
  Follow up Call-  Call back number 08/01/2020  Post procedure Call Back phone  # [phone]  Permission to leave phone message Yes  Some recent data might be hidden     Patient questions:  Do you have a fever, pain , or abdominal swelling? No. Pain Score  0 *  Have you tolerated food without any problems? Yes.    Have you been able to return to your normal activities? Yes.    Do you have any questions about your discharge instructions: Diet   No. Medications  No. Follow up visit  No.  Do you have questions or concerns about your Care? No.  Actions: * If pain score is 4 or above: No action needed, pain <4. 1. Have you developed a fever since your procedure? no  2.   Have you had an respiratory symptoms (SOB or cough) since your procedure? no  3.   Have you tested positive for COVID 19 since your procedure no  4.   Have you had any family members/close contacts diagnosed with the COVID 19 since your procedure?  no   If yes to any of these questions please route to Joylene John, RN and Joella Prince, RN

## 2020-08-15 NOTE — Progress Notes (Addendum)
COVID Vaccine Completed:  x3 Date COVID Vaccine completed: 06-07-19, 07-08-19 Has received booster:  10-21 COVID vaccine manufacturer: Pfizer    Moderna     Date of COVID positive in last 90 days:  N/A  PCP - Stoney Bang, MD Cardiologist - Dr. Gelene Mink in [place].  Note  Cardiac clearance in Epic/on chart dated 07-13-20 by Dr. Sabra Heck  Chest x-ray - N/A EKG - 07-06-20 on chart Stress Test -  ECHO -  Cardiac Cath -  Pacemaker/ICD device last checked: Spinal Cord Stimulator:  Sleep Study - N/A CPAP -   Fasting Blood Sugar - N/A Checks Blood Sugar _____ times a day  Blood Thinner Instructions: Aspirin Instructions:  ASA 81 mg Last Dose:  08-19-20  Activity level:  Can go up a flight of stairs and perform activities of daily living without stopping and without symptoms of chest pain or shortness of breath.  Able to exercise without symptoms  Anesthesia review: COPD, CAD, HTN.  Incomplete left BBB.  CABG 1991, cath 2011 with stents  Patient denies shortness of breath, fever, cough and chest pain at PAT appointment   Patient verbalized understanding of instructions that were given to them at the PAT appointment. Patient was also instructed that they will need to review over the PAT instructions again at home before surgery.

## 2020-08-15 NOTE — Patient Instructions (Addendum)
DUE TO COVID-19 ONLY ONE VISITOR IS ALLOWED TO COME WITH YOU AND STAY IN THE WAITING ROOM ONLY DURING PRE OP AND PROCEDURE.   IF YOU WILL BE ADMITTED INTO THE HOSPITAL YOU ARE ALLOWED ONLY TWO SUPPORT PEOPLE DURING VISITATION HOURS ONLY (10AM -8PM)   . The support person(s) may change daily. . The support person(s) must pass our screening, gel in and out, and wear a mask at all times, including in the patient's room. . Patients must also wear a mask when staff or their support person are in the room.  No visitors under the age of 72. Any visitor under the age of 74 must be accompanied by an adult.    COVID SWAB TESTING MUST BE COMPLETED ON:  Thursday, 08-23-20 @ 1:00 PM   [address] [place], [place] [address]  (Must self quarantine after testing. Follow instructions on handout.)       Your procedure is scheduled on: Monday, 08-27-20   Report to [facility] Main  Entrance   Report to Short Stay at 5:50 AM   [facility])    Call this number if you have problems the morning of surgery [phone]   Do not eat food :After Midnight.   May have liquids until 5:15 AM day of surgery  CLEAR LIQUID DIET  Foods Allowed                                                                     Foods Excluded  Water, Black Coffee and tea, regular and decaf               liquids that you cannot  Plain Jell-O in any flavor  (No red)                                     see through such as: Fruit ices (not with fruit pulp)                                      milk, soups, orange juice              Iced Popsicles (No red)                                      All solid food                                   Apple juices Sports drinks like Gatorade (No red) Lightly seasoned clear broth or consume(fat free) Sugar, honey syrup     Complete one Ensure drink the morning of surgery at  5:15 AM  the day of surgery.     1. The day of surgery:  ? Drink ONE (1) Pre-Surgery Clear Ensure or G2  by am the morning of surgery. Drink in one sitting. Do not sip.  ? This drink was given to you during your hospital  pre-op appointment  visit. ? Nothing else to drink after completing the  Pre-Surgery Clear Ensure or G2.          If you have questions, please contact your surgeon's office.     Oral Hygiene is also important to reduce your risk of infection.                                    Remember - BRUSH YOUR TEETH THE MORNING OF SURGERY WITH YOUR REGULAR TOOTHPASTE   Do NOT smoke after Midnight  Take these medicines the morning of surgery with A SIP OF WATER:  Amlodipine, Metoprolol, Omeprazole, Theodur, Lorazepam,  Albuterol inhaler (bring with you day of surgery)                               You may not have any metal on your body including  jewelry, and body piercings             Do not wear  lotions, powders, perfumes/cologne, or deodorant            Men may shave face and neck.   Do not bring valuables to the hospital. [place].   Contacts, dentures or bridgework may not be worn into surgery.   Bring small overnight bag day of surgery.    Special Instructions: Bring a copy of your healthcare power of attorney and living will documents         the day of surgery if you haven't  scanned them in before.              Please read over the following fact sheets you were given: IF YOU HAVE QUESTIONS ABOUT YOUR PRE OP INSTRUCTIONS PLEASE CALL  [address] - Preparing for Surgery Before surgery, you can play an important role.  Because skin is not sterile, your skin needs to be as free of germs as possible.  You can reduce the number of germs on your skin by washing with CHG (chlorahexidine gluconate) soap before surgery.  CHG is an antiseptic cleaner which kills germs and bonds with the skin to continue killing germs even after washing. Please DO NOT use if you have an allergy to CHG or antibacterial soaps.  If  your skin becomes reddened/irritated stop using the CHG and inform your nurse when you arrive at Short Stay. Do not shave (including legs and underarms) for at least 48 hours prior to the first CHG shower.  You may shave your face/neck.  Please follow these instructions carefully:  1.  Shower with CHG Soap the night before surgery and the  morning of surgery.  2.  If you choose to wash your hair, wash your hair first as usual with your normal  shampoo.  3.  After you shampoo, rinse your hair and body thoroughly to remove the shampoo.                             4.  Use CHG as you would any other liquid soap.  You can apply chg directly to the skin and wash.  Gently with a scrungie or clean washcloth.  5.  Apply the CHG Soap to your  body ONLY FROM THE NECK DOWN.   Do   not use on face/ open                           Wound or open sores. Avoid contact with eyes, ears mouth and   genitals (private parts).                       Wash face,  Genitals (private parts) with your normal soap.             6.  Wash thoroughly, paying special attention to the area where your    surgery  will be performed.  7.  Thoroughly rinse your body with warm water from the neck down.  8.  DO NOT shower/wash with your normal soap after using and rinsing off the CHG Soap.                9.  Pat yourself dry with a clean towel.            10.  Wear clean pajamas.            11.  Place clean sheets on your bed the night of your first shower and do not  sleep with pets. Day of Surgery : Do not apply any lotions/deodorants the morning of surgery.  Please wear clean clothes to the hospital/surgery center.  FAILURE TO FOLLOW THESE INSTRUCTIONS MAY RESULT IN THE CANCELLATION OF YOUR SURGERY  PATIENT SIGNATURE_________________________________  NURSE SIGNATURE__________________________________  ________________________________________________________________________   Gabriel Mcdonald  An incentive spirometer is a  tool that can help keep your lungs clear and active. This tool measures how well you are filling your lungs with each breath. Taking long deep breaths may help reverse or decrease the chance of developing breathing (pulmonary) problems (especially infection) following:  A long period of time when you are unable to move or be active. BEFORE THE PROCEDURE   If the spirometer includes an indicator to show your best effort, your nurse or respiratory therapist will set it to a desired goal.  If possible, sit up straight or lean slightly forward. Try not to slouch.  Hold the incentive spirometer in an upright position. INSTRUCTIONS FOR USE  1. Sit on the edge of your bed if possible, or sit up as far as you can in bed or on a chair. 2. Hold the incentive spirometer in an upright position. 3. Breathe out normally. 4. Place the mouthpiece in your mouth and seal your lips tightly around it. 5. Breathe in slowly and as deeply as possible, raising the piston or the ball toward the top of the column. 6. Hold your breath for 3-5 seconds or for as long as possible. Allow the piston or ball to fall to the bottom of the column. 7. Remove the mouthpiece from your mouth and breathe out normally. 8. Rest for a few seconds and repeat Steps 1 through 7 at least 10 times every 1-2 hours when you are awake. Take your time and take a few normal breaths between deep breaths. 9. The spirometer may include an indicator to show your best effort. Use the indicator as a goal to work toward during each repetition. 10. After each set of 10 deep breaths, practice coughing to be sure your lungs are clear. If you have an incision (the cut made at the time of surgery), support your incision when coughing by placing a pillow  or rolled up towels firmly against it. Once you are able to get out of bed, walk around indoors and cough well. You may stop using the incentive spirometer when instructed by your caregiver.  RISKS AND  COMPLICATIONS  Take your time so you do not get dizzy or light-headed.  If you are in pain, you may need to take or ask for pain medication before doing incentive spirometry. It is harder to take a deep breath if you are having pain. AFTER USE  Rest and breathe slowly and easily.  It can be helpful to keep track of a log of your progress. Your caregiver can provide you with a simple table to help with this. If you are using the spirometer at home, follow these instructions: [place] IF:   You are having difficultly using the spirometer.  You have trouble using the spirometer as often as instructed.  Your pain medication is not giving enough relief while using the spirometer.  You develop fever of 100.5 F (38.1 C) or higher. SEEK IMMEDIATE MEDICAL CARE IF:   You cough up bloody sputum that had not been present before.  You develop fever of 102 F (38.9 C) or greater.  You develop worsening pain at or near the incision site. MAKE SURE YOU:   Understand these instructions.  Will watch your condition.  Will get help right away if you are not doing well or get worse. Document Released: 09/22/2006 Document Revised: 08/04/2011 Document Reviewed: 11/23/2006 ExitCare Patient Information 2014 Exit[employer], [place].   ________________________________________________________________________  WHAT IS A BLOOD TRANSFUSION? Blood Transfusion Information  A transfusion is the replacement of blood or some of its parts. Blood is made up of multiple cells which provide different functions.  Red blood cells carry oxygen and are used for blood loss replacement.  White blood cells fight against infection.  Platelets control bleeding.  Plasma helps clot blood.  Other blood products are available for specialized needs, such as hemophilia or other clotting disorders. BEFORE THE TRANSFUSION  Who gives blood for transfusions?   Healthy volunteers who are fully evaluated to make sure  their blood is safe. This is blood bank blood. Transfusion therapy is the safest it has ever been in the practice of medicine. Before blood is taken from a donor, a complete history is taken to make sure that person has no history of diseases nor engages in risky social behavior (examples are intravenous drug use or sexual activity with multiple partners). The donor's travel history is screened to minimize risk of transmitting infections, such as malaria. The donated blood is tested for signs of infectious diseases, such as HIV and hepatitis. The blood is then tested to be sure it is compatible with you in order to minimize the chance of a transfusion reaction. If you or a relative donates blood, this is often done in anticipation of surgery and is not appropriate for emergency situations. It takes many days to process the donated blood. RISKS AND COMPLICATIONS Although transfusion therapy is very safe and saves many lives, the main dangers of transfusion include:   Getting an infectious disease.  Developing a transfusion reaction. This is an allergic reaction to something in the blood you were given. Every precaution is taken to prevent this. The decision to have a blood transfusion has been considered carefully by your caregiver before blood is given. Blood is not given unless the benefits outweigh the risks. AFTER THE TRANSFUSION  Right after receiving a blood transfusion, you will  usually feel much better and more energetic. This is especially true if your red blood cells have gotten low (anemic). The transfusion raises the level of the red blood cells which carry oxygen, and this usually causes an energy increase.  The nurse administering the transfusion will monitor you carefully for complications. HOME CARE INSTRUCTIONS  No special instructions are needed after a transfusion. You may find your energy is better. Speak with your caregiver about any limitations on activity for underlying diseases  you may have. SEEK MEDICAL CARE IF:   Your condition is not improving after your transfusion.  You develop redness or irritation at the intravenous (IV) site. SEEK IMMEDIATE MEDICAL CARE IF:  Any of the following symptoms occur over the next 12 hours:  Shaking chills.  You have a temperature by mouth above 102 F (38.9 C), not controlled by medicine.  Chest, back, or muscle pain.  People around you feel you are not acting correctly or are confused.  Shortness of breath or difficulty breathing.  Dizziness and fainting.  You get a rash or develop hives.  You have a decrease in urine output.  Your urine turns a dark color or changes to pink, red, or brown. Any of the following symptoms occur over the next 10 days:  You have a temperature by mouth above 102 F (38.9 C), not controlled by medicine.  Shortness of breath.  Weakness after normal activity.  The white part of the eye turns yellow (jaundice).  You have a decrease in the amount of urine or are urinating less often.  Your urine turns a dark color or changes to pink, red, or brown. Document Released: 05/09/2000 Document Revised: 08/04/2011 Document Reviewed: 12/27/2007 [facility] Patient Information 2014 [place], [place].  _______________________________________________________________________

## 2020-08-21 ENCOUNTER — Encounter (HOSPITAL_COMMUNITY)
Admission: RE | Admit: 2020-08-21 | Discharge: 2020-08-21 | Disposition: A | Discharge status: Home / Self Care | Payer: Medicare Other | Source: Ambulatory Visit | Attending: Orthopedic Surgery | Admitting: Orthopedic Surgery

## 2020-08-21 ENCOUNTER — Other Ambulatory Visit: Payer: Self-pay

## 2020-08-21 ENCOUNTER — Encounter (HOSPITAL_COMMUNITY): Payer: Self-pay

## 2020-08-21 DIAGNOSIS — Z01812 Encounter for preprocedural laboratory examination: Secondary | ICD-10-CM | POA: Insufficient documentation

## 2020-08-21 LAB — CBC
HCT: 36.6 % — ABNORMAL LOW (ref 39.0–52.0)
Hemoglobin: 12.6 g/dL — ABNORMAL LOW (ref 13.0–17.0)
MCH: 31.4 pg (ref 26.0–34.0)
MCHC: 34.4 g/dL (ref 30.0–36.0)
MCV: 91.3 fL (ref 80.0–100.0)
Platelets: 239 10*3/uL (ref 150–400)
RBC: 4.01 MIL/uL — ABNORMAL LOW (ref 4.22–5.81)
RDW: 13.9 % (ref 11.5–15.5)
WBC: 5.7 10*3/uL (ref 4.0–10.5)
nRBC: 0 % (ref 0.0–0.2)

## 2020-08-21 LAB — SURGICAL PCR SCREEN
MRSA, PCR: NEGATIVE
Staphylococcus aureus: POSITIVE — AB

## 2020-08-21 LAB — COMPREHENSIVE METABOLIC PANEL
ALT: 19 U/L (ref 0–44)
AST: 25 U/L (ref 15–41)
Albumin: 3.9 g/dL (ref 3.5–5.0)
Alkaline Phosphatase: 74 U/L (ref 38–126)
Anion gap: 6 (ref 5–15)
BUN: 19 mg/dL (ref 8–23)
CO2: 26 mmol/L (ref 22–32)
Calcium: 9.6 mg/dL (ref 8.9–10.3)
Chloride: 106 mmol/L (ref 98–111)
Creatinine, Ser: 1.32 mg/dL — ABNORMAL HIGH (ref 0.61–1.24)
GFR, Estimated: 53 mL/min — ABNORMAL LOW (ref >60)
Glucose, Bld: 115 mg/dL — ABNORMAL HIGH (ref 70–99)
Potassium: 4 mmol/L (ref 3.5–5.1)
Sodium: 138 mmol/L (ref 135–145)
Total Bilirubin: 0.8 mg/dL (ref 0.3–1.2)
Total Protein: 6.5 g/dL (ref 6.5–8.1)

## 2020-08-21 LAB — PROTIME-INR
INR: 1 (ref 0.8–1.2)
Prothrombin Time: 12.5 seconds (ref 11.4–15.2)

## 2020-08-21 LAB — APTT: aPTT: 31 seconds (ref 24–36)

## 2020-08-21 NOTE — Progress Notes (Signed)
PCR results sent to Dr. Aluisio to review. 

## 2020-08-22 NOTE — Progress Notes (Signed)
Cardiac clearance from Ardeen Jourdain, MD

## 2020-08-22 NOTE — H&P (Signed)
TOTAL KNEE ADMISSION H&P  Patient is being admitted for left total knee arthroplasty.  Subjective:  Chief Complaint: Left knee pain.  HPI: Gabriel Mcdonald, 87 y.o. male has a history of pain and functional disability in the left knee due to arthritis and has failed non-surgical conservative treatments for greater than 12 weeks to include corticosteriod injections and activity modification. Onset of symptoms was gradual, starting several years ago with gradually worsening course since that time. The patient noted no past surgery on the left knee.  Patient currently rates pain in the left knee at 8 out of 10 with activity. Patient has worsening of pain with activity and weight bearing, pain that interferes with activities of daily living and crepitus. Patient has evidence of  bone-on-bone arthritis in the medial and patellofemoral compartments of the left knee by imaging studies. There is no active infection.  Patient Active Problem List   Diagnosis Date Noted  . OA (osteoarthritis) of knee 05/14/2020  . Renal calculus, left 05/29/2015  . Lumbar herniated disc 05/03/2013    Past Medical History:  Diagnosis Date  . Cancer [facility])    prostate  . Cataract   . COPD (chronic obstructive pulmonary disease) (HCC)    Pleural thickening per pt, pt reports exposure to asbestos  . Coronary artery disease   . GERD (gastroesophageal reflux disease)    mild  . High cholesterol   . Hypertension   . Shortness of breath     Past Surgical History:  Procedure Laterality Date  . CATARACT EXTRACTION W/PHACO  04/29/2012   Procedure: CATARACT EXTRACTION PHACO AND INTRAOCULAR LENS PLACEMENT (IOC);  Surgeon: Tonny Branch, MD;  Location: [facility] ORS;  Service: Ophthalmology;  Laterality: Right;  CDE:10.09  . CATARACT EXTRACTION W/PHACO  06/07/2012   Procedure: CATARACT EXTRACTION PHACO AND INTRAOCULAR LENS PLACEMENT (IOC);  Surgeon: Tonny Branch, MD;  Location: [facility] ORS;  Service: Ophthalmology;  Laterality: Left;   CDE:16.19  . CORONARY ANGIOPLASTY  2011   stents  . [place]    . LUMBAR LAMINECTOMY/DECOMPRESSION MICRODISCECTOMY Right 05/03/2013   Procedure: RIGHT LUMBAR DISCECTOMY LUMBAR FOUR-FIVE;  Surgeon: Floyce Stakes, MD;  Location: [facility] NEURO ORS;  Service: Neurosurgery;  Laterality: Right;  right   . Pylonodial cyst removal     from tip of spine  . TONSILLECTOMY      Prior to Admission medications   Medication Sig Start Date End Date Taking? Authorizing Provider  albuterol (PROVENTIL HFA;VENTOLIN HFA) 108 (90 BASE) MCG/ACT inhaler Inhale 2 puffs into the lungs every 6 (six) hours as needed for shortness of breath or wheezing.   Yes [provider]  amLODipine (NORVASC) 5 MG tablet Take 5 mg by mouth daily.    Yes [provider]  aspirin 81 MG tablet Take 81 mg by mouth every evening.    Yes [provider]  atorvastatin (LIPITOR) 10 MG tablet Take 10 mg by mouth every evening.    Yes [provider]  Cholecalciferol (VITAMIN D-3) 5000 UNITS TABS Take 5,000 Units by mouth daily.   Yes [provider]  diclofenac Sodium (VOLTAREN) 1 % GEL Apply 2 g topically daily as needed (pain).   Yes [provider]  FEROSUL 325 (65 Fe) MG tablet Take 325 mg by mouth 2 (two) times daily. 05/16/20  Yes [provider]  folic acid (FOLVITE) 1 MG tablet Take 1 mg by mouth daily.   Yes  [provider]  lisinopril (ZESTRIL) 20 MG tablet Take 20 mg by mouth daily.    Yes [provider]  LORazepam (ATIVAN) 1 MG tablet Take 0.5 mg by mouth daily as needed for anxiety.    Yes [provider]  metoprolol succinate (TOPROL-XL) 50 MG 24 hr tablet Take 50 mg by mouth every other day. Take with or immediately following a meal.   Yes [provider]  Omega-3 Fatty Acids (FISH OIL) 1200 MG CAPS Take 1,200 mg by mouth in the morning and at bedtime.   Yes [provider]  omeprazole (PRILOSEC) 20 MG capsule Take 20 mg by mouth daily.   Yes [provider]  polyethylene glycol powder (GLYCOLAX/MIRALAX) 17 GM/SCOOP powder Take 17 g by mouth daily as needed for moderate constipation.   Yes [provider]  Soft Lens Products (REFRESH CONTACTS DROPS) SOLN Place 1 drop into both eyes daily as needed (dry eyes).    Yes [provider]  tamsulosin (FLOMAX) 0.4 MG CAPS capsule Take 0.4 mg by mouth every evening.  01/19/17  Yes [provider]  theophylline (THEODUR) 200 MG 12 hr tablet Take 400 mg by mouth every morning.    Yes [provider]  tiZANidine (ZANAFLEX) 4 MG tablet Take 4 mg by mouth every 6 (six) hours as needed for muscle spasms.   Yes [provider]  traMADol (ULTRAM) 50 MG tablet Take 50 mg by mouth every 6 (six) hours as needed for moderate pain.  12/12/16  Yes [provider]  vitamin B-12 (CYANOCOBALAMIN) 1000 MCG tablet Take 1,000 mcg by mouth every other day. 05/24/20  Yes [provider]    No Active Allergies  Social History   Socioeconomic History  . Marital status: Widowed    Spouse name: Not on file  . Number of children: Not on file  . Years of education: Not on file  . Highest education level: Not on file  Occupational History  . Not on file  Tobacco Use  . Smoking status: Never Smoker  . Smokeless tobacco: Never Used  Vaping Use  . Vaping Use: Never used  Substance and Sexual Activity  . Alcohol use: No  . Drug use: No  . Sexual activity: Not on file  Other Topics Concern  . Not on file  Social History Narrative  . Not on file   Social Determinants of Health   Financial Resource Strain: Not on file  Food Insecurity: Not on file  Transportation Needs: Not on file  Physical Activity: Not on file  Stress: Not on file  Social Connections: Not on file  Intimate Partner Violence: Not on file    Tobacco Use: Low Risk   . Smoking  Tobacco Use: Never Smoker  . Smokeless Tobacco Use: Never Used   Social History   Substance and Sexual Activity  Alcohol Use No    Family History  Problem Relation Age of Onset  . Colon cancer Neg Hx   . Stomach cancer Neg Hx   . Pancreatic cancer Neg Hx   . Esophageal cancer Neg Hx   . Liver disease Neg Hx     Review of Systems  Constitutional: Negative for chills and fever.  HENT: Negative for congestion, sore throat and tinnitus.   Eyes: Negative for double vision, photophobia and pain.  Respiratory: Negative for cough, shortness of breath and wheezing.   Cardiovascular: Negative for chest pain, palpitations and orthopnea.  Gastrointestinal: Negative for heartburn,  nausea and vomiting.  Genitourinary: Negative for dysuria, frequency and urgency.  Musculoskeletal: Positive for joint pain.  Neurological: Negative for dizziness, weakness and headaches.    Objective:  Physical Exam: Well nourished and well developed.  General: Alert and oriented x3, cooperative and pleasant, no acute distress.  Head: normocephalic, atraumatic, neck supple.  Eyes: EOMI.  Respiratory: breath sounds clear in all fields, no wheezing, rales, or rhonchi. Cardiovascular: Regular rate and rhythm, no murmurs, gallops or rubs.  Abdomen: non-tender to palpation and soft, normoactive bowel sounds. Musculoskeletal:  Left Knee Exam:  Moderate effusion present. No swelling present.  The range of motion is: 5 to 115 degrees.  Positive crepitus on range of motion of the knee.  Positive medial joint line tenderness.  No lateral joint line tenderness.  The knee is stable.   Calves soft and nontender. Motor function intact in LE. Strength 5/5 LE bilaterally. Neuro: Distal pulses 2+. Sensation to light touch intact in LE.  Imaging Review Plain radiographs demonstrate severe degenerative joint disease of the left knee. The overall alignment is neutral. The bone quality appears to be adequate for age  and reported activity level.  Assessment/Plan:  End stage arthritis, left knee   The patient history, physical examination, clinical judgment of the provider and imaging studies are consistent with end stage degenerative joint disease of the left knee and total knee arthroplasty is deemed medically necessary. The treatment options including medical management, injection therapy arthroscopy and arthroplasty were discussed at length. The risks and benefits of total knee arthroplasty were presented and reviewed. The risks due to aseptic loosening, infection, stiffness, patella tracking problems, thromboembolic complications and other imponderables were discussed. The patient acknowledged the explanation, agreed to proceed with the plan and consent was signed. Patient is being admitted for inpatient treatment for surgery, pain control, PT, OT, prophylactic antibiotics, VTE prophylaxis, progressive ambulation and ADLs and discharge planning. The patient is planning to be discharged home.   Patient's anticipated LOS is less than 2 midnights, meeting these requirements: - Lives within 1 hour of care - Has a competent adult at home to recover with post-op recover - NO history of  - Chronic pain requiring opiods  - Diabetes  - Coronary Artery Disease  - Heart failure  - Heart attack  - Stroke  - DVT/VTE  - Cardiac arrhythmia  - Respiratory Failure/COPD  - Renal failure  - Anemia  - Advanced Liver disease  Therapy Plans: Outpatient therapy at [facility] Disposition: Home with girlfriend Planned DVT Prophylaxis: Xarelto 10 mg QD DME Needed: Gilford Rile PCP: Stoney Bang, MD (clearance received) Cardiologist: Ardeen Jourdain, MD (clearance received) TXA: IV Allergies: NKDA  Anesthesia Concerns: History of difficult intubation (spinal preferred) BMI: 26 Last HgbA1c: Not diabetic.  Pharmacy: [facility]  Other: History prostate CA, CAD with CABG and stent placements  - Patient was  instructed on what medications to stop prior to surgery. - Follow-up visit in 2 weeks with Dr. Wynelle Link - Begin physical therapy following surgery - Pre-operative lab work as pre-surgical testing - Prescriptions will be provided in hospital at time of discharge  Theresa Duty, PA-C Orthopedic Surgery [facility] [place]

## 2020-08-23 ENCOUNTER — Other Ambulatory Visit (HOSPITAL_COMMUNITY): Payer: Medicare Other

## 2020-08-24 ENCOUNTER — Other Ambulatory Visit (HOSPITAL_COMMUNITY)
Admission: RE | Admit: 2020-08-24 | Discharge: 2020-08-24 | Disposition: A | Discharge status: Home / Self Care | Payer: Medicare Other | Source: Ambulatory Visit | Attending: Orthopedic Surgery | Admitting: Orthopedic Surgery

## 2020-08-24 DIAGNOSIS — Z01812 Encounter for preprocedural laboratory examination: Secondary | ICD-10-CM | POA: Diagnosis not present

## 2020-08-24 DIAGNOSIS — Z20822 Contact with and (suspected) exposure to covid-19: Secondary | ICD-10-CM | POA: Insufficient documentation

## 2020-08-24 LAB — SARS CORONAVIRUS 2 (TAT 6-24 HRS): SARS Coronavirus 2: NEGATIVE

## 2020-08-26 MED ORDER — BUPIVACAINE LIPOSOME 1.3 % IJ SUSP
20.0000 mL | INTRAMUSCULAR | Status: DC
  Filled 2020-08-26: qty 20

## 2020-08-26 NOTE — Anesthesia Preprocedure Evaluation (Addendum)
Anesthesia Evaluation  Patient identified by MRN, date of birth, ID band Patient awake    Reviewed: Allergy & Precautions, NPO status , Patient's Chart, lab work & pertinent test results, reviewed documented beta blocker date and time   Airway Mallampati: II  TM Distance: >3 FB Neck ROM: Full    Dental  (+) Teeth Intact, Dental Advisory Given, Caps   Pulmonary COPD,    Pulmonary exam normal breath sounds clear to auscultation       Cardiovascular hypertension, Pt. on medications and Pt. on home beta blockers + CAD, + Cardiac Stents and + CABG  Normal cardiovascular exam Rhythm:Regular Rate:Normal     Neuro/Psych negative neurological ROS  negative psych ROS   GI/Hepatic Neg liver ROS, GERD  Medicated,  Endo/Other  negative endocrine ROS  Renal/GU negative Renal ROS   Prostate cancer     Musculoskeletal  (+) Arthritis , Osteoarthritis,    Abdominal   Peds  Hematology  (+) Blood dyscrasia, anemia , Plt 239k   Anesthesia Other Findings   Reproductive/Obstetrics                            Anesthesia Physical Anesthesia Plan  ASA: III  Anesthesia Plan: Spinal   Post-op Pain Management:  Regional for Post-op pain   Induction: Intravenous  PONV Risk Score and Plan: 1 and Propofol infusion, Dexamethasone and Ondansetron  Airway Management Planned: Natural Airway and Nasal Cannula  Additional Equipment:   Intra-op Plan:   Post-operative Plan:   Informed Consent: I have reviewed the patients History and Physical, chart, labs and discussed the procedure including the risks, benefits and alternatives for the proposed anesthesia with the patient or authorized representative who has indicated his/her understanding and acceptance.     Dental advisory given  Plan Discussed with: CRNA  Anesthesia Plan Comments:        Anesthesia Quick Evaluation

## 2020-08-27 ENCOUNTER — Other Ambulatory Visit: Payer: Self-pay

## 2020-08-27 ENCOUNTER — Encounter (HOSPITAL_COMMUNITY)
Admission: RE | Disposition: A | Discharge status: Home / Self Care | Payer: Self-pay | Source: Home / Self Care | Attending: Orthopedic Surgery

## 2020-08-27 ENCOUNTER — Ambulatory Visit (HOSPITAL_COMMUNITY): Payer: Medicare Other | Admitting: Certified Registered Nurse Anesthetist

## 2020-08-27 ENCOUNTER — Encounter (HOSPITAL_COMMUNITY): Payer: Self-pay | Admitting: Orthopedic Surgery

## 2020-08-27 ENCOUNTER — Ambulatory Visit (HOSPITAL_COMMUNITY): Payer: Medicare Other | Admitting: Physician Assistant

## 2020-08-27 ENCOUNTER — Inpatient Hospital Stay (HOSPITAL_COMMUNITY)
Admission: RE | Admit: 2020-08-27 | Discharge: 2020-08-29 | DRG: 470 | Disposition: A | Discharge status: Home / Self Care | Payer: Medicare Other | Attending: Orthopedic Surgery | Admitting: Orthopedic Surgery

## 2020-08-27 DIAGNOSIS — R001 Bradycardia, unspecified: Secondary | ICD-10-CM | POA: Diagnosis not present

## 2020-08-27 DIAGNOSIS — M5126 Other intervertebral disc displacement, lumbar region: Secondary | ICD-10-CM | POA: Diagnosis present

## 2020-08-27 DIAGNOSIS — M1712 Unilateral primary osteoarthritis, left knee: Secondary | ICD-10-CM | POA: Diagnosis present

## 2020-08-27 DIAGNOSIS — R410 Disorientation, unspecified: Secondary | ICD-10-CM | POA: Diagnosis not present

## 2020-08-27 DIAGNOSIS — D649 Anemia, unspecified: Secondary | ICD-10-CM | POA: Diagnosis not present

## 2020-08-27 DIAGNOSIS — I129 Hypertensive chronic kidney disease with stage 1 through stage 4 chronic kidney disease, or unspecified chronic kidney disease: Secondary | ICD-10-CM | POA: Diagnosis not present

## 2020-08-27 DIAGNOSIS — I440 Atrioventricular block, first degree: Secondary | ICD-10-CM | POA: Diagnosis not present

## 2020-08-27 DIAGNOSIS — K5900 Constipation, unspecified: Secondary | ICD-10-CM | POA: Diagnosis present

## 2020-08-27 DIAGNOSIS — Z7982 Long term (current) use of aspirin: Secondary | ICD-10-CM

## 2020-08-27 DIAGNOSIS — M170 Bilateral primary osteoarthritis of knee: Principal | ICD-10-CM | POA: Diagnosis present

## 2020-08-27 DIAGNOSIS — Z8546 Personal history of malignant neoplasm of prostate: Secondary | ICD-10-CM

## 2020-08-27 DIAGNOSIS — J449 Chronic obstructive pulmonary disease, unspecified: Secondary | ICD-10-CM | POA: Diagnosis present

## 2020-08-27 DIAGNOSIS — E7800 Pure hypercholesterolemia, unspecified: Secondary | ICD-10-CM | POA: Diagnosis not present

## 2020-08-27 DIAGNOSIS — Z955 Presence of coronary angioplasty implant and graft: Secondary | ICD-10-CM

## 2020-08-27 DIAGNOSIS — N179 Acute kidney failure, unspecified: Secondary | ICD-10-CM | POA: Diagnosis not present

## 2020-08-27 DIAGNOSIS — Z781 Physical restraint status: Secondary | ICD-10-CM

## 2020-08-27 DIAGNOSIS — Y92230 Patient room in hospital as the place of occurrence of the external cause: Secondary | ICD-10-CM | POA: Diagnosis not present

## 2020-08-27 DIAGNOSIS — K219 Gastro-esophageal reflux disease without esophagitis: Secondary | ICD-10-CM | POA: Diagnosis present

## 2020-08-27 DIAGNOSIS — Z79899 Other long term (current) drug therapy: Secondary | ICD-10-CM

## 2020-08-27 DIAGNOSIS — N1831 Chronic kidney disease, stage 3a: Secondary | ICD-10-CM | POA: Diagnosis not present

## 2020-08-27 DIAGNOSIS — Z87442 Personal history of urinary calculi: Secondary | ICD-10-CM

## 2020-08-27 DIAGNOSIS — I2510 Atherosclerotic heart disease of native coronary artery without angina pectoris: Secondary | ICD-10-CM | POA: Diagnosis not present

## 2020-08-27 DIAGNOSIS — Z77090 Contact with and (suspected) exposure to asbestos: Secondary | ICD-10-CM | POA: Diagnosis present

## 2020-08-27 DIAGNOSIS — Z951 Presence of aortocoronary bypass graft: Secondary | ICD-10-CM

## 2020-08-27 DIAGNOSIS — M25762 Osteophyte, left knee: Secondary | ICD-10-CM | POA: Diagnosis present

## 2020-08-27 DIAGNOSIS — T40605A Adverse effect of unspecified narcotics, initial encounter: Secondary | ICD-10-CM | POA: Diagnosis not present

## 2020-08-27 DIAGNOSIS — F05 Delirium due to known physiological condition: Secondary | ICD-10-CM

## 2020-08-27 DIAGNOSIS — G8918 Other acute postprocedural pain: Secondary | ICD-10-CM | POA: Diagnosis not present

## 2020-08-27 DIAGNOSIS — Z9079 Acquired absence of other genital organ(s): Secondary | ICD-10-CM

## 2020-08-27 DIAGNOSIS — I10 Essential (primary) hypertension: Secondary | ICD-10-CM | POA: Diagnosis not present

## 2020-08-27 HISTORY — PX: TOTAL KNEE ARTHROPLASTY: SHX125

## 2020-08-27 LAB — BPAM RBC
Blood Product Expiration Date: 202204142359
Blood Product Expiration Date: 202204162359
Unit Type and Rh: 5100
Unit Type and Rh: 5100

## 2020-08-27 LAB — TYPE AND SCREEN
ABO/RH(D): O POS
Antibody Screen: POSITIVE
Unit division: 0
Unit division: 0

## 2020-08-27 SURGERY — ARTHROPLASTY, KNEE, TOTAL
Anesthesia: Spinal | Site: Knee | Laterality: Left

## 2020-08-27 MED ORDER — POVIDONE-IODINE 10 % EX SWAB
2.0000 "application " | Freq: Once | CUTANEOUS | Status: AC
  Administered 2020-08-27: 2 via TOPICAL

## 2020-08-27 MED ORDER — ONDANSETRON HCL 4 MG/2ML IJ SOLN
INTRAMUSCULAR | Status: DC | PRN
  Administered 2020-08-27: 4 mg via INTRAVENOUS

## 2020-08-27 MED ORDER — TIZANIDINE HCL 4 MG PO TABS: 4.0000 mg | ORAL_TABLET | Freq: Four times a day (QID) | ORAL | Status: DC | PRN

## 2020-08-27 MED ORDER — METOCLOPRAMIDE HCL 5 MG/ML IJ SOLN
5.0000 mg | Freq: Three times a day (TID) | INTRAMUSCULAR | Status: DC | PRN
Start: 2020-08-27 — End: 2020-08-29

## 2020-08-27 MED ORDER — MORPHINE SULFATE (PF) 2 MG/ML IV SOLN
0.5000 mg | INTRAVENOUS | Status: DC | PRN
  Administered 2020-08-27: 1 mg via INTRAVENOUS
  Filled 2020-08-27: qty 1

## 2020-08-27 MED ORDER — CEFAZOLIN SODIUM-DEXTROSE 2-4 GM/100ML-% IV SOLN
2.0000 g | Freq: Four times a day (QID) | INTRAVENOUS | Status: AC
  Administered 2020-08-27 (×2): 2 g via INTRAVENOUS
  Filled 2020-08-27 (×2): qty 100

## 2020-08-27 MED ORDER — BISACODYL 10 MG RE SUPP: 10.0000 mg | Freq: Every day | RECTAL | Status: DC | PRN

## 2020-08-27 MED ORDER — BUPIVACAINE IN DEXTROSE 0.75-8.25 % IT SOLN
INTRATHECAL | Status: DC | PRN
  Administered 2020-08-27: 1.6 mL via INTRATHECAL

## 2020-08-27 MED ORDER — FLEET ENEMA 7-19 GM/118ML RE ENEM: 1.0000 | ENEMA | Freq: Once | RECTAL | Status: DC | PRN

## 2020-08-27 MED ORDER — RIVAROXABAN 10 MG PO TABS
10.0000 mg | ORAL_TABLET | Freq: Every day | ORAL | Status: DC
  Administered 2020-08-28 – 2020-08-29 (×2): 10 mg via ORAL
  Filled 2020-08-27 (×2): qty 1

## 2020-08-27 MED ORDER — METOCLOPRAMIDE HCL 5 MG PO TABS: 5.0000 mg | ORAL_TABLET | Freq: Three times a day (TID) | ORAL | Status: DC | PRN

## 2020-08-27 MED ORDER — MIDAZOLAM HCL 2 MG/2ML IJ SOLN
INTRAMUSCULAR | Status: AC
  Filled 2020-08-27: qty 2

## 2020-08-27 MED ORDER — DEXAMETHASONE SODIUM PHOSPHATE 10 MG/ML IJ SOLN
INTRAMUSCULAR | Status: AC
  Filled 2020-08-27: qty 1

## 2020-08-27 MED ORDER — FENTANYL CITRATE (PF) 100 MCG/2ML IJ SOLN
50.0000 ug | Freq: Once | INTRAMUSCULAR | Status: DC
  Filled 2020-08-27: qty 2

## 2020-08-27 MED ORDER — ACETAMINOPHEN 10 MG/ML IV SOLN
1000.0000 mg | Freq: Four times a day (QID) | INTRAVENOUS | Status: DC
  Administered 2020-08-27: 1000 mg via INTRAVENOUS
  Filled 2020-08-27: qty 100

## 2020-08-27 MED ORDER — MIDAZOLAM HCL 2 MG/2ML IJ SOLN
1.0000 mg | INTRAMUSCULAR | Status: DC
  Filled 2020-08-27: qty 2

## 2020-08-27 MED ORDER — ORAL CARE MOUTH RINSE: 15.0000 mL | Freq: Once | OROMUCOSAL | Status: AC

## 2020-08-27 MED ORDER — OXYCODONE HCL 5 MG PO TABS
5.0000 mg | ORAL_TABLET | ORAL | Status: DC | PRN
  Administered 2020-08-27 (×2): 10 mg via ORAL
  Filled 2020-08-27 (×2): qty 2

## 2020-08-27 MED ORDER — SODIUM CHLORIDE 0.9 % IV SOLN: INTRAVENOUS | Status: DC

## 2020-08-27 MED ORDER — AMLODIPINE BESYLATE 5 MG PO TABS
5.0000 mg | ORAL_TABLET | Freq: Every day | ORAL | Status: DC
  Administered 2020-08-28: 5 mg via ORAL
  Filled 2020-08-27: qty 1

## 2020-08-27 MED ORDER — METHOCARBAMOL 500 MG PO TABS
500.0000 mg | ORAL_TABLET | Freq: Four times a day (QID) | ORAL | Status: DC | PRN
  Administered 2020-08-27 – 2020-08-28 (×2): 500 mg via ORAL
  Filled 2020-08-27 (×2): qty 1

## 2020-08-27 MED ORDER — CLONIDINE HCL (ANALGESIA) 100 MCG/ML EP SOLN
EPIDURAL | Status: DC | PRN
  Administered 2020-08-27: 50 ug

## 2020-08-27 MED ORDER — SODIUM CHLORIDE (PF) 0.9 % IJ SOLN
INTRAMUSCULAR | Status: DC | PRN
  Administered 2020-08-27: 60 mL

## 2020-08-27 MED ORDER — POLYETHYLENE GLYCOL 3350 17 G PO PACK
17.0000 g | PACK | Freq: Every day | ORAL | Status: DC | PRN
  Administered 2020-08-28: 17 g via ORAL
  Filled 2020-08-27: qty 1

## 2020-08-27 MED ORDER — FERROUS SULFATE 325 (65 FE) MG PO TABS
325.0000 mg | ORAL_TABLET | Freq: Two times a day (BID) | ORAL | Status: DC
  Administered 2020-08-27 – 2020-08-29 (×3): 325 mg via ORAL
  Filled 2020-08-27 (×3): qty 1

## 2020-08-27 MED ORDER — PROPOFOL 500 MG/50ML IV EMUL
INTRAVENOUS | Status: DC | PRN
  Administered 2020-08-27: 25 ug/kg/min via INTRAVENOUS

## 2020-08-27 MED ORDER — PHENOL 1.4 % MT LIQD
1.0000 | OROMUCOSAL | Status: DC | PRN
Start: 2020-08-27 — End: 2020-08-29

## 2020-08-27 MED ORDER — ONDANSETRON HCL 4 MG/2ML IJ SOLN
INTRAMUSCULAR | Status: AC
  Filled 2020-08-27: qty 2

## 2020-08-27 MED ORDER — TRANEXAMIC ACID-NACL 1000-0.7 MG/100ML-% IV SOLN
1000.0000 mg | INTRAVENOUS | Status: AC
  Administered 2020-08-27: 1000 mg via INTRAVENOUS
  Filled 2020-08-27: qty 100

## 2020-08-27 MED ORDER — PANTOPRAZOLE SODIUM 40 MG PO TBEC
80.0000 mg | DELAYED_RELEASE_TABLET | Freq: Every day | ORAL | Status: DC
  Administered 2020-08-28 – 2020-08-29 (×2): 80 mg via ORAL
  Filled 2020-08-27 (×2): qty 2

## 2020-08-27 MED ORDER — PROPOFOL 10 MG/ML IV BOLUS
INTRAVENOUS | Status: AC
  Filled 2020-08-27: qty 20

## 2020-08-27 MED ORDER — DOCUSATE SODIUM 100 MG PO CAPS
100.0000 mg | ORAL_CAPSULE | Freq: Two times a day (BID) | ORAL | Status: DC
  Administered 2020-08-27 – 2020-08-29 (×4): 100 mg via ORAL
  Filled 2020-08-27 (×6): qty 1

## 2020-08-27 MED ORDER — SODIUM CHLORIDE 0.9 % IR SOLN
Status: DC | PRN
  Administered 2020-08-27 (×2): 1000 mL

## 2020-08-27 MED ORDER — DEXAMETHASONE SODIUM PHOSPHATE 10 MG/ML IJ SOLN: 10.0000 mg | Freq: Once | INTRAMUSCULAR | Status: DC

## 2020-08-27 MED ORDER — ONDANSETRON HCL 4 MG PO TABS: 4.0000 mg | ORAL_TABLET | Freq: Four times a day (QID) | ORAL | Status: DC | PRN

## 2020-08-27 MED ORDER — ONDANSETRON HCL 4 MG/2ML IJ SOLN: 4.0000 mg | Freq: Four times a day (QID) | INTRAMUSCULAR | Status: DC | PRN

## 2020-08-27 MED ORDER — DEXAMETHASONE SODIUM PHOSPHATE 10 MG/ML IJ SOLN
8.0000 mg | Freq: Once | INTRAMUSCULAR | Status: AC
  Administered 2020-08-27: 8 mg via INTRAVENOUS

## 2020-08-27 MED ORDER — CHLORHEXIDINE GLUCONATE 0.12 % MT SOLN
15.0000 mL | Freq: Once | OROMUCOSAL | Status: AC
  Administered 2020-08-27: 15 mL via OROMUCOSAL

## 2020-08-27 MED ORDER — CEFAZOLIN SODIUM-DEXTROSE 2-4 GM/100ML-% IV SOLN
2.0000 g | INTRAVENOUS | Status: AC
  Administered 2020-08-27: 2 g via INTRAVENOUS
  Filled 2020-08-27: qty 100

## 2020-08-27 MED ORDER — LACTATED RINGERS IV SOLN: INTRAVENOUS | Status: DC

## 2020-08-27 MED ORDER — THEOPHYLLINE ER 200 MG PO TB12
400.0000 mg | ORAL_TABLET | Freq: Every morning | ORAL | Status: DC
  Filled 2020-08-27: qty 2

## 2020-08-27 MED ORDER — ATORVASTATIN CALCIUM 10 MG PO TABS
10.0000 mg | ORAL_TABLET | Freq: Every evening | ORAL | Status: DC
  Administered 2020-08-27 – 2020-08-28 (×2): 10 mg via ORAL
  Filled 2020-08-27 (×2): qty 1

## 2020-08-27 MED ORDER — TAMSULOSIN HCL 0.4 MG PO CAPS
0.4000 mg | ORAL_CAPSULE | Freq: Every evening | ORAL | Status: DC
  Administered 2020-08-27 – 2020-08-28 (×2): 0.4 mg via ORAL
  Filled 2020-08-27 (×2): qty 1

## 2020-08-27 MED ORDER — METHOCARBAMOL 500 MG IVPB - SIMPLE MED
500.0000 mg | Freq: Four times a day (QID) | INTRAVENOUS | Status: DC | PRN
  Filled 2020-08-27: qty 50

## 2020-08-27 MED ORDER — FENTANYL CITRATE (PF) 100 MCG/2ML IJ SOLN
INTRAMUSCULAR | Status: AC
  Filled 2020-08-27: qty 2

## 2020-08-27 MED ORDER — BUPIVACAINE LIPOSOME 1.3 % IJ SUSP
INTRAMUSCULAR | Status: DC | PRN
  Administered 2020-08-27: 20 mL

## 2020-08-27 MED ORDER — PROPOFOL 10 MG/ML IV BOLUS
INTRAVENOUS | Status: DC | PRN
  Administered 2020-08-27: 10 mg via INTRAVENOUS
  Administered 2020-08-27: 20 mg via INTRAVENOUS

## 2020-08-27 MED ORDER — METOPROLOL SUCCINATE ER 50 MG PO TB24: 50.0000 mg | ORAL_TABLET | ORAL | Status: DC

## 2020-08-27 MED ORDER — BUPIVACAINE-EPINEPHRINE (PF) 0.25% -1:200000 IJ SOLN
INTRAMUSCULAR | Status: AC
  Filled 2020-08-27: qty 30

## 2020-08-27 MED ORDER — PROPOFOL 500 MG/50ML IV EMUL
INTRAVENOUS | Status: AC
  Filled 2020-08-27: qty 50

## 2020-08-27 MED ORDER — SODIUM CHLORIDE (PF) 0.9 % IJ SOLN
INTRAMUSCULAR | Status: AC
  Filled 2020-08-27: qty 10

## 2020-08-27 MED ORDER — LORAZEPAM 0.5 MG PO TABS: 0.5000 mg | ORAL_TABLET | Freq: Every day | ORAL | Status: DC | PRN

## 2020-08-27 MED ORDER — MENTHOL 3 MG MT LOZG: 1.0000 | LOZENGE | OROMUCOSAL | Status: DC | PRN

## 2020-08-27 MED ORDER — TRAMADOL HCL 50 MG PO TABS
50.0000 mg | ORAL_TABLET | Freq: Four times a day (QID) | ORAL | Status: DC | PRN
  Administered 2020-08-27: 100 mg via ORAL
  Administered 2020-08-28: 50 mg via ORAL
  Filled 2020-08-27: qty 2
  Filled 2020-08-27: qty 1

## 2020-08-27 MED ORDER — ACETAMINOPHEN 500 MG PO TABS
1000.0000 mg | ORAL_TABLET | Freq: Four times a day (QID) | ORAL | Status: AC
  Administered 2020-08-27 – 2020-08-28 (×4): 1000 mg via ORAL
  Filled 2020-08-27 (×5): qty 2

## 2020-08-27 MED ORDER — FENTANYL CITRATE (PF) 100 MCG/2ML IJ SOLN
INTRAMUSCULAR | Status: DC | PRN
  Administered 2020-08-27: 50 ug via INTRAVENOUS

## 2020-08-27 MED ORDER — ROPIVACAINE HCL 7.5 MG/ML IJ SOLN
INTRAMUSCULAR | Status: DC | PRN
  Administered 2020-08-27: 20 mL via PERINEURAL

## 2020-08-27 MED ORDER — EPHEDRINE SULFATE-NACL 50-0.9 MG/10ML-% IV SOSY
PREFILLED_SYRINGE | INTRAVENOUS | Status: DC | PRN
  Administered 2020-08-27 (×5): 10 mg via INTRAVENOUS

## 2020-08-27 MED ORDER — TIZANIDINE HCL 4 MG PO TABS
4.0000 mg | ORAL_TABLET | Freq: Four times a day (QID) | ORAL | Status: DC | PRN
  Administered 2020-08-28: 4 mg via ORAL
  Filled 2020-08-27: qty 1

## 2020-08-27 MED ORDER — ALBUTEROL SULFATE HFA 108 (90 BASE) MCG/ACT IN AERS: 2.0000 | INHALATION_SPRAY | Freq: Four times a day (QID) | RESPIRATORY_TRACT | Status: DC | PRN

## 2020-08-27 MED ORDER — DIPHENHYDRAMINE HCL 12.5 MG/5ML PO ELIX: 12.5000 mg | ORAL_SOLUTION | ORAL | Status: DC | PRN

## 2020-08-27 SURGICAL SUPPLY — 54 items
BAG DECANTER FOR FLEXI CONT (MISCELLANEOUS) ×3 IMPLANT
BAG ZIPLOCK 12X15 (MISCELLANEOUS) ×3 IMPLANT
BLADE SAG 18X100X1.27 (BLADE) ×3 IMPLANT
BLADE SAW SGTL 11.0X1.19X90.0M (BLADE) ×3 IMPLANT
BNDG ELASTIC 6X5.8 VLCR STR LF (GAUZE/BANDAGES/DRESSINGS) ×3 IMPLANT
BOWL SMART MIX CTS (DISPOSABLE) ×3 IMPLANT
CEMENT HV SMART SET (Cement) ×6 IMPLANT
CEMENT TIBIA MBT SIZE 5 (Knees) ×1 IMPLANT
CLOSURE WOUND 1/2 X4 (GAUZE/BANDAGES/DRESSINGS) ×2
COVER SURGICAL LIGHT HANDLE (MISCELLANEOUS) ×3 IMPLANT
COVER WAND RF STERILE (DRAPES) IMPLANT
CUFF TOURN SGL QUICK 34 (TOURNIQUET CUFF) ×2
CUFF TRNQT CYL 34X4.125X (TOURNIQUET CUFF) ×1 IMPLANT
DECANTER SPIKE VIAL GLASS SM (MISCELLANEOUS) ×3 IMPLANT
DRAPE U-SHAPE 47X51 STRL (DRAPES) ×3 IMPLANT
DRSG AQUACEL AG ADV 3.5X10 (GAUZE/BANDAGES/DRESSINGS) ×3 IMPLANT
DURAPREP 26ML APPLICATOR (WOUND CARE) ×3 IMPLANT
ELECT REM PT RETURN 15FT ADLT (MISCELLANEOUS) ×3 IMPLANT
FEMUR SIGMA PS SZ 5.0 L (Femur) ×3 IMPLANT
GLOVE SRG 8 PF TXTR STRL LF DI (GLOVE) ×1 IMPLANT
GLOVE SURG ENC MOIS LTX SZ6.5 (GLOVE) ×3 IMPLANT
GLOVE SURG ENC MOIS LTX SZ8 (GLOVE) ×6 IMPLANT
GLOVE SURG UNDER POLY LF SZ6.5 (GLOVE) ×3 IMPLANT
GLOVE SURG UNDER POLY LF SZ8 (GLOVE) ×2
GLOVE SURG UNDER POLY LF SZ8.5 (GLOVE) ×3 IMPLANT
GOWN STRL REUS W/TWL LRG LVL3 (GOWN DISPOSABLE) ×6 IMPLANT
GOWN STRL REUS W/TWL XL LVL3 (GOWN DISPOSABLE) ×3 IMPLANT
HANDPIECE INTERPULSE COAX TIP (DISPOSABLE) ×2
HOLDER FOLEY CATH W/STRAP (MISCELLANEOUS) ×3 IMPLANT
IMMOBILIZER KNEE 20 (SOFTGOODS) ×3
IMMOBILIZER KNEE 20 THIGH 36 (SOFTGOODS) ×1 IMPLANT
KIT TURNOVER KIT A (KITS) ×3 IMPLANT
MANIFOLD NEPTUNE II (INSTRUMENTS) ×3 IMPLANT
NS IRRIG 1000ML POUR BTL (IV SOLUTION) ×3 IMPLANT
PACK TOTAL KNEE CUSTOM (KITS) ×3 IMPLANT
PADDING CAST COTTON 6X4 STRL (CAST SUPPLIES) ×3 IMPLANT
PATELLA DOME PFC 38MM (Knees) ×3 IMPLANT
PENCIL SMOKE EVACUATOR (MISCELLANEOUS) ×3 IMPLANT
PIN STEINMAN FIXATION KNEE (PIN) ×3 IMPLANT
PLATE ROT INSERT 10MM SIZE 5 (Plate) ×3 IMPLANT
PROTECTOR NERVE ULNAR (MISCELLANEOUS) ×3 IMPLANT
SET HNDPC FAN SPRY TIP SCT (DISPOSABLE) ×1 IMPLANT
STRIP CLOSURE SKIN 1/2X4 (GAUZE/BANDAGES/DRESSINGS) ×4 IMPLANT
SUT MNCRL AB 4-0 PS2 18 (SUTURE) ×3 IMPLANT
SUT STRATAFIX 0 PDS 27 VIOLET (SUTURE) ×3
SUT VIC AB 2-0 CT1 27 (SUTURE) ×6
SUT VIC AB 2-0 CT1 TAPERPNT 27 (SUTURE) ×3 IMPLANT
SUTURE STRATFX 0 PDS 27 VIOLET (SUTURE) ×1 IMPLANT
SYR BULB IRRIG 60ML STRL (SYRINGE) ×3 IMPLANT
TIBIA MBT CEMENT SIZE 5 (Knees) ×3 IMPLANT
TRAY FOLEY MTR SLVR 16FR STAT (SET/KITS/TRAYS/PACK) ×3 IMPLANT
WATER STERILE IRR 1000ML POUR (IV SOLUTION) ×6 IMPLANT
WRAP KNEE MAXI GEL POST OP (GAUZE/BANDAGES/DRESSINGS) ×3 IMPLANT
YANKAUER SUCT BULB TIP NO VENT (SUCTIONS) ×3 IMPLANT

## 2020-08-27 NOTE — Progress Notes (Signed)
Orthopedic Tech Progress Note Patient Details:  Gabriel Mcdonald [name] Jul 21, 1931 [id number]  CPM Left Knee CPM Left Knee: On Left Knee Flexion (Degrees): 40 Left Knee Extension (Degrees): 10  Post Interventions Patient Tolerated: Well Instructions Provided: Care of device  [name] 08/27/2020, 9:49 AM

## 2020-08-27 NOTE — Care Plan (Signed)
Ortho Bundle Case Management Note  Patient Details  Name: Dane W [name] MRN: [id number] Date of Birth: Sep 30, 1931                   L TKA on 08-27-20 DCP: Home with girlfriend. 2 story home with 2 steps. DME: RW ordered through [facility]. Has 3in1. Has chair lift. PT: [facility] 4[facility]7   DME Arranged:  Walker rolling DME Agency:  [employer]  HH Arranged:    [place] Agency:     Additional Comments: Please contact me with any questions of if this plan should need to change.  Marianne Sofia, RN,CCM [facility]  [phone] 08/27/2020, 9:31 AM

## 2020-08-27 NOTE — Interval H&P Note (Signed)
History and Physical Interval Note:  08/27/2020 6:21 AM  Gabriel Mcdonald  has presented today for surgery, with the diagnosis of left knee osteoarthritis.  The various methods of treatment have been discussed with the patient and family. After consideration of risks, benefits and other options for treatment, the patient has consented to  Procedure(s) with comments: TOTAL KNEE ARTHROPLASTY (Left) - 59min as a surgical intervention.  The patient's history has been reviewed, patient examined, no change in status, stable for surgery.  I have reviewed the patient's chart and labs.  Questions were answered to the patient's satisfaction.     Pilar Plate Mayela Bullard

## 2020-08-27 NOTE — Anesthesia Procedure Notes (Signed)
Procedure Name: MAC Date/Time: 08/27/2020 7:26 AM Performed by: West Pugh, CRNA Pre-anesthesia Checklist: Patient identified, Emergency Drugs available, Suction available, Patient being monitored and Timeout performed Patient Re-evaluated:Patient Re-evaluated prior to induction Oxygen Delivery Method: Simple face mask Preoxygenation: Pre-oxygenation with 100% oxygen Induction Type: IV induction Placement Confirmation: positive ETCO2

## 2020-08-27 NOTE — Discharge Instructions (Addendum)
Gabriel Arabian, MD Total Joint Specialist [facility] [place] [address], [address] [place], [place] [address] [phone]  TOTAL KNEE REPLACEMENT POSTOPERATIVE DIRECTIONS    Knee Rehabilitation, Guidelines Following Surgery  Results after knee surgery are often greatly improved when you follow the exercise, range of motion and muscle strengthening exercises prescribed by your doctor. Safety measures are also important to protect the knee from further injury. If any of these exercises cause you to have increased pain or swelling in your knee joint, decrease the amount until you are comfortable again and slowly increase them. If you have problems or questions, call your caregiver or physical therapist for advice.   BLOOD CLOT PREVENTION . Take a 10 mg Xarelto once a day for three weeks following surgery. Then resume one 81 mg aspirin once a day. . You may resume your vitamins/supplements once you have discontinued the Xarelto. . Do not take any NSAIDs (Advil, Aleve, Ibuprofen, Meloxicam, etc.) until you have discontinued the Xarelto.   Information on my medicine - XARELTO (Rivaroxaban)  Why was Xarelto prescribed for you? Xarelto was prescribed for you to reduce the risk of blood clots forming after orthopedic surgery. The medical term for these abnormal blood clots is venous thromboembolism (VTE).  What do you need to know about xarelto ? Take your Xarelto ONCE DAILY at the same time every day. You may take it either with or without food.  If you have difficulty swallowing the tablet whole, you may crush it and mix in applesauce just prior to taking your dose.  Take Xarelto exactly as prescribed by your doctor and DO NOT stop taking Xarelto without talking to the doctor who prescribed the medication.  Stopping without other VTE prevention medication to take the place of Xarelto may increase your risk of developing a clot.  After discharge, you should have  regular check-up appointments with your healthcare provider that is prescribing your Xarelto.    What do you do if you miss a dose? If you miss a dose, take it as soon as you remember on the same day then continue your regularly scheduled once daily regimen the next day. Do not take two doses of Xarelto on the same day.   Important Safety Information A possible side effect of Xarelto is bleeding. You should call your healthcare provider right away if you experience any of the following: ? Bleeding from an injury or your nose that does not stop. ? Unusual colored urine (red or dark brown) or unusual colored stools (red or black). ? Unusual bruising for unknown reasons. ? A serious fall or if you hit your head (even if there is no bleeding).  Some medicines may interact with Xarelto and might increase your risk of bleeding while on Xarelto. To help avoid this, consult your healthcare provider or pharmacist prior to using any new prescription or non-prescription medications, including herbals, vitamins, non-steroidal anti-inflammatory drugs (NSAIDs) and supplements.  This website has more information on Xarelto: [email or web address]-benson.[email or web address]    HOME CARE INSTRUCTIONS  . Remove items at home which could result in a fall. This includes throw rugs or furniture in walking pathways.  . ICE to the affected knee as much as tolerated. Icing helps control swelling. If the swelling is well controlled you will be more comfortable and rehab easier. Continue to use ice on the knee for pain and swelling from surgery. You may notice swelling that will progress down to the foot and ankle. This is normal after surgery.  Elevate the leg when you are not up walking on it.    . Continue to use the breathing machine which will help keep your temperature down. It is common for your temperature to cycle up and down following surgery, especially at night when you are not up moving around and exerting yourself. The breathing  machine keeps your lungs expanded and your temperature down. . Do not place pillow under the operative knee, focus on keeping the knee straight while resting  DIET You may resume your previous home diet once you are discharged from the hospital.  DRESSING / [place] / SHOWERING . Keep your bulky bandage on for 2 days. On the third post-operative day you may remove the Ace bandage and gauze. There is a waterproof adhesive bandage on your skin which will stay in place until your first follow-up appointment. Once you remove this you will not need to place another bandage . You may begin showering 3 days following surgery, but do not submerge the incision under water.  ACTIVITY For the first 5 days, the key is rest and control of pain and swelling . Do your home exercises twice a day starting on post-operative day 3. On the days you go to physical therapy, just do the home exercises once that day. . You should rest, ice and elevate the leg for 50 minutes out of every hour. Get up and walk/stretch for 10 minutes per hour. After 5 days you can increase your activity slowly as tolerated. . Walk with your walker as instructed. Use the walker until you are comfortable transitioning to a cane. Walk with the cane in the opposite hand of the operative leg. You may discontinue the cane once you are comfortable and walking steadily. . Avoid periods of inactivity such as sitting longer than an hour when not asleep. This helps prevent blood clots.  . You may discontinue the knee immobilizer once you are able to perform a straight leg raise while lying down. . You may resume a sexual relationship in one month or when given the OK by your doctor.  . You may return to work once you are cleared by your doctor.  . Do not drive a car for 6 weeks or until released by your surgeon.  . Do not drive while taking narcotics.  TED HOSE STOCKINGS Wear the elastic stockings on both legs for three weeks following surgery  during the day. You may remove them at night for sleeping.  WEIGHT BEARING Weight bearing as tolerated with assist device (walker, cane, etc) as directed, use it as long as suggested by your surgeon or therapist, typically at least 4-6 weeks.  POSTOPERATIVE CONSTIPATION PROTOCOL Constipation - defined medically as fewer than three stools per week and severe constipation as less than one stool per week.  One of the most common issues patients have following surgery is constipation.  Even if you have a regular bowel pattern at home, your normal regimen is likely to be disrupted due to multiple reasons following surgery.  Combination of anesthesia, postoperative narcotics, change in appetite and fluid intake all can affect your bowels.  In order to avoid complications following surgery, here are some recommendations in order to help you during your recovery period.  . Colace (docusate) - Pick up an over-the-counter form of Colace or another stool softener and take twice a day as long as you are requiring postoperative pain medications.  Take with a full glass of water daily.  If you experience  loose stools or diarrhea, hold the colace until you stool forms back up. If your symptoms do not get better within 1 week or if they get worse, check with your doctor. . Dulcolax (bisacodyl) - Pick up over-the-counter and take as directed by the product packaging as needed to assist with the movement of your bowels.  Take with a full glass of water.  Use this product as needed if not relieved by Colace only.  . MiraLax (polyethylene glycol) - Pick up over-the-counter to have on hand. MiraLax is a solution that will increase the amount of water in your bowels to assist with bowel movements.  Take as directed and can mix with a glass of water, juice, soda, coffee, or tea. Take if you go more than two days without a movement. Do not use MiraLax more than once per day. Call your doctor if you are still constipated or  irregular after using this medication for 7 days in a row.  If you continue to have problems with postoperative constipation, please contact the office for further assistance and recommendations.  If you experience "the worst abdominal pain ever" or develop nausea or vomiting, please contact the office immediatly for further recommendations for treatment.  ITCHING If you experience itching with your medications, try taking only a single pain pill, or even half a pain pill at a time.  You can also use Benadryl over the counter for itching or also to help with sleep.   MEDICATIONS See your medication summary on the "After Visit Summary" that the nursing staff will review with you prior to discharge.  You may have some home medications which will be placed on hold until you complete the course of blood thinner medication.  It is important for you to complete the blood thinner medication as prescribed by your surgeon.  Continue your approved medications as instructed at time of discharge.  PRECAUTIONS . If you experience chest pain or shortness of breath - call 911 immediately for transfer to the hospital emergency department.  . If you develop a fever greater that 101 F, purulent drainage from wound, increased redness or drainage from wound, foul odor from the wound/dressing, or calf pain - CONTACT YOUR SURGEON.                                                   FOLLOW-UP APPOINTMENTS Make sure you keep all of your appointments after your operation with your surgeon and caregivers. You should call the office at the above phone number and make an appointment for approximately two weeks after the date of your surgery or on the date instructed by your surgeon outlined in the "After Visit Summary".  RANGE OF MOTION AND STRENGTHENING EXERCISES  Rehabilitation of the knee is important following a knee injury or an operation. After just a few days of immobilization, the muscles of the thigh which control the  knee become weakened and shrink (atrophy). Knee exercises are designed to build up the tone and strength of the thigh muscles and to improve knee motion. Often times heat used for twenty to thirty minutes before working out will loosen up your tissues and help with improving the range of motion but do not use heat for the first two weeks following surgery. These exercises can be done on a training (exercise) mat, on the floor,  on a table or on a bed. Use what ever works the best and is most comfortable for you Knee exercises include:  . Leg Lifts - While your knee is still immobilized in a splint or cast, you can do straight leg raises. Lift the leg to 60 degrees, hold for 3 sec, and slowly lower the leg. Repeat 10-20 times 2-3 times daily. Perform this exercise against resistance later as your knee gets better.  Javier Docker and Hamstring Sets - Tighten up the muscle on the front of the thigh (Quad) and hold for 5-10 sec. Repeat this 10-20 times hourly. Hamstring sets are done by pushing the foot backward against an object and holding for 5-10 sec. Repeat as with quad sets.   Leg Slides: Lying on your back, slowly slide your foot toward your buttocks, bending your knee up off the floor (only go as far as is comfortable). Then slowly slide your foot back down until your leg is flat on the floor again.  Angel Wings: Lying on your back spread your legs to the side as far apart as you can without causing discomfort.  A rehabilitation program following serious knee injuries can speed recovery and prevent re-injury in the future due to weakened muscles. Contact your doctor or a physical therapist for more information on knee rehabilitation.   POST-OPERATIVE OPIOID TAPER INSTRUCTIONS: . It is important to wean off of your opioid medication as soon as possible. If you do not need pain medication after your surgery it is ok to stop day one. Marland Kitchen Opioids include: o Codeine, Hydrocodone(Norco, Vicodin), Oxycodone(Percocet,  oxycontin) and hydromorphone amongst others.  . Long term and even short term use of opiods can cause: o Increased pain response o Dependence o Constipation o Depression o Respiratory depression o And more.  . Withdrawal symptoms can include o Flu like symptoms o Nausea, vomiting o And more . Techniques to manage these symptoms o Hydrate well o Eat regular healthy meals o Stay active o Use relaxation techniques(deep breathing, meditating, yoga) . Do Not substitute Alcohol to help with tapering . If you have been on opioids for less than two weeks and do not have pain than it is ok to stop all together.  . Plan to wean off of opioids o This plan should start within one week post op of your joint replacement. o Maintain the same interval or time between taking each dose and first decrease the dose.  o Cut the total daily intake of opioids by one tablet each day o Next start to increase the time between doses. o The last dose that should be eliminated is the evening dose.     IF YOU ARE TRANSFERRED TO A SKILLED REHAB FACILITY If the patient is transferred to a skilled rehab facility following release from the hospital, a list of the current medications will be sent to the facility for the patient to continue.  When discharged from the skilled rehab facility, please have the facility set up the patient's [place] prior to being released. Also, the skilled facility will be responsible for providing the patient with their medications at time of release from the facility to include their pain medication, the muscle relaxants, and their blood thinner medication. If the patient is still at the rehab facility at time of the two week follow up appointment, the skilled rehab facility will also need to assist the patient in arranging follow up appointment in our office and any transportation needs.  MAKE SURE YOU:  . Understand these instructions.  . Get help right away if you  are not doing well or get worse.   DENTAL ANTIBIOTICS:  In most cases prophylactic antibiotics for Dental procdeures after total joint surgery are not necessary.  Exceptions are as follows:  1. History of prior total joint infection  2. Severely immunocompromised (Organ Transplant, cancer chemotherapy, Rheumatoid biologic meds such as Anderson)  3. Poorly controlled diabetes (A1C &gt; 8.0, blood glucose over 200)  If you have one of these conditions, contact your surgeon for an antibiotic prescription, prior to your dental procedure.    Pick up stool softner and laxative for home use following surgery while on pain medications. Do not submerge incision under water. Please use good hand washing techniques while changing dressing each day. May shower starting three days after surgery. Please use a clean towel to pat the incision dry following showers. Continue to use ice for pain and swelling after surgery. Do not use any lotions or creams on the incision until instructed by your surgeon.

## 2020-08-27 NOTE — Progress Notes (Signed)
Patients temperature has been on the lower side since he arrived to the unit from PACU ranging from 96-lower 97's. Patient and his significant other report that it's normal for him to have temps in that range. Patient hasn't shown any abnormal signs or symptoms from the temperatures. Dr Wynelle Link was made aware when rounding this afternoon. No new orders were placed. Patient sitting up in bed watching TV no complaints voiced at this time.

## 2020-08-27 NOTE — Op Note (Signed)
OPERATIVE REPORT-TOTAL KNEE ARTHROPLASTY   Pre-operative diagnosis- Osteoarthritis  Left knee(s)  Post-operative diagnosis- Osteoarthritis Left knee(s)  Procedure-  Left  Total Knee Arthroplasty  Surgeon- Dione Plover. Zenith Kercheval, MD  Assistant- Theresa Duty, PA-C   Anesthesia-  adductor canal block and spinal  EBL-50 mL   Drains None  Tourniquet time-  Total Tourniquet Time Documented: Thigh (Left) - 41 minutes Total: Thigh (Left) - 41 minutes     Complications- None  Condition-PACU - hemodynamically stable.   Brief Clinical Note   Gabriel Mcdonald is a 87 y.o. year old male with end stage OA of his left knee with progressively worsening pain and dysfunction. He has constant pain, with activity and at rest and significant functional deficits with difficulties even with ADLs. He has had extensive non-op management including analgesics, injections of cortisone and viscosupplements, and home exercise program, but remains in significant pain with significant dysfunction. Radiographs show bone on bone arthritis patellofemoral. He presents now for left Total Knee Arthroplasty.    Procedure in detail---   The patient is brought into the operating room and positioned supine on the operating table. After successful administration of  adductor canal block and spinal,   a tourniquet is placed high on the  Left thigh(s) and the lower extremity is prepped and draped in the usual sterile fashion. Time out is performed by the operating team and then the  Left lower extremity is wrapped in Esmarch, knee flexed and the tourniquet inflated to 300 mmHg.       A midline incision is made with a ten blade through the subcutaneous tissue to the level of the extensor mechanism. A fresh blade is used to make a medial parapatellar arthrotomy. Soft tissue over the proximal medial tibia is subperiosteally elevated to the joint line with a knife and into the semimembranosus bursa with a Cobb elevator. Soft tissue  over the proximal lateral tibia is elevated with attention being paid to avoiding the patellar tendon on the tibial tubercle. The patella is everted, knee flexed 90 degrees and the ACL and PCL are removed. Findings are bone on bone medial and patelloefemoral with large global osteophytes.      The drill is used to create a starting hole in the distal femur and the canal is thoroughly irrigated with sterile saline to remove the fatty contents. The 5 degree Left  valgus alignment guide is placed into the femoral canal and the distal femoral cutting block is pinned to remove 10 mm off the distal femur. Resection is made with an oscillating saw.      The tibia is subluxed forward and the menisci are removed. The extramedullary alignment guide is placed referencing proximally at the medial aspect of the tibial tubercle and distally along the second metatarsal axis and tibial crest. The block is pinned to remove 40mm off the more deficient medial  side. Resection is made with an oscillating saw. Size 5is the most appropriate size for the tibia and the proximal tibia is prepared with the modular drill and keel punch for that size.      The femoral sizing guide is placed and size 5 is most appropriate. Rotation is marked off the epicondylar axis and confirmed by creating a rectangular flexion gap at 90 degrees. The size 5 cutting block is pinned in this rotation and the anterior, posterior and chamfer cuts are made with the oscillating saw. The intercondylar block is then placed and that cut is made.  Trial size 5 tibial component, trial size 5 posterior stabilized femur and a 10  mm posterior stabilized rotating platform insert trial is placed. Full extension is achieved with excellent varus/valgus and anterior/posterior balance throughout full range of motion. The patella is everted and thickness measured to be 24  mm. Free hand resection is taken to 14 mm, a 38 template is placed, lug holes are drilled, trial  patella is placed, and it tracks normally. Osteophytes are removed off the posterior femur with the trial in place. All trials are removed and the cut bone surfaces prepared with pulsatile lavage. Cement is mixed and once ready for implantation, the size 5 tibial implant, size  5 posterior stabilized femoral component, and the size 38 patella are cemented in place and the patella is held with the clamp. The trial insert is placed and the knee held in full extension. The Exparel (20 ml mixed with 60 ml saline) is injected into the extensor mechanism, posterior capsule, medial and lateral gutters and subcutaneous tissues.  All extruded cement is removed and once the cement is hard the permanent 10 mm posterior stabilized rotating platform insert is placed into the tibial tray.      The wound is copiously irrigated with saline solution and the extensor mechanism closed with # 0 Stratofix suture. The tourniquet is released for a total tourniquet time of 41  minutes. Flexion against gravity is 140 degrees and the patella tracks normally. Subcutaneous tissue is closed with 2.0 vicryl and subcuticular with running 4.0 Monocryl. The incision is cleaned and dried and steri-strips and a bulky sterile dressing are applied. The limb is placed into a knee immobilizer and the patient is awakened and transported to recovery in stable condition.      Please note that a surgical assistant was a medical necessity for this procedure in order to perform it in a safe and expeditious manner. Surgical assistant was necessary to retract the ligaments and vital neurovascular structures to prevent injury to them and also necessary for proper positioning of the limb to allow for anatomic placement of the prosthesis.   Dione Plover Laquon Emel, MD    08/27/2020, 8:33 AM

## 2020-08-27 NOTE — Anesthesia Procedure Notes (Signed)
Spinal  Patient location during procedure: OR Start time: 08/27/2020 7:26 AM End time: 08/27/2020 7:29 AM Reason for block: surgical anesthesia Staffing Performed: anesthesiologist  Anesthesiologist: Catalina Gravel, MD Preanesthetic Checklist Completed: patient identified, IV checked, risks and benefits discussed, surgical consent, monitors and equipment checked, pre-op evaluation and timeout performed Spinal Block Patient position: sitting Prep: DuraPrep and site prepped and draped Patient monitoring: continuous pulse ox and blood pressure Approach: midline Location: L3-4 Injection technique: single-shot Needle Needle type: Pencan  Needle gauge: 24 G Assessment Sensory level: T6 Events: CSF return Additional Notes Functioning IV was confirmed and monitors were applied. Sterile prep and drape, including hand hygiene, mask and sterile gloves were used. The patient was positioned and the spine was prepped. The skin was anesthetized with lidocaine.  Free flow of clear CSF was obtained prior to injecting local anesthetic into the CSF.  The spinal needle aspirated freely following injection.  The needle was carefully withdrawn.  The patient tolerated the procedure well. Consent was obtained prior to procedure with all questions answered and concerns addressed. Risks including but not limited to bleeding, infection, nerve damage, paralysis, failed block, inadequate analgesia, allergic reaction, high spinal, itching and headache were discussed and the patient wished to proceed.   Hoy Morn, MD

## 2020-08-27 NOTE — Anesthesia Postprocedure Evaluation (Signed)
Anesthesia Post Note  Patient: Gabriel Mcdonald  Procedure(s) Performed: TOTAL KNEE ARTHROPLASTY (Left Knee)     Patient location during evaluation: PACU Anesthesia Type: Spinal Level of consciousness: oriented, awake and alert and awake Pain management: pain level controlled Vital Signs Assessment: post-procedure vital signs reviewed and stable Respiratory status: spontaneous breathing, respiratory function stable and nonlabored ventilation Cardiovascular status: blood pressure returned to baseline and stable Postop Assessment: no headache, no backache, no apparent nausea or vomiting, patient able to bend at knees and spinal receding Anesthetic complications: no   No complications documented.  Last Vitals:  Vitals:   08/27/20 1307 08/27/20 1442  BP:  (!) 150/59  Pulse:  (!) 55  Resp:  14  Temp: (!) 36.3 C (!) 36.2 C  SpO2:  99%    Last Pain:  Vitals:   08/27/20 1622  TempSrc:   PainSc: [place]

## 2020-08-27 NOTE — Evaluation (Signed)
Physical Therapy Evaluation Patient Details Name: Gabriel Mcdonald [name] MRN: [id number] DOB: 1931/12/22 Today's Date: 08/27/2020   History of Present Illness  87 y.o. male admitted 08/27/20 for L TKA. PMH of L4-5 discectomy, CAD, prostate ca, COPD, CABG.  Clinical Impression  Pt is s/p TKA resulting in the deficits listed below (see PT Problem List). Pt ambulated 42' with RW, no loss of balance. Initiated TKA HEP. Good progress expected.  Pt will benefit from skilled PT to increase their independence and safety with mobility to allow discharge to the venue listed below.      Follow Up Recommendations Follow surgeon's recommendation for DC plan and follow-up therapies;Outpatient PT    Equipment Recommendations  None recommended by PT    Recommendations for Other Services       Precautions / Restrictions Precautions Precautions: Knee Precaution Comments: instructed pt/CG in no pillow under knee Restrictions Weight Bearing Restrictions: Yes LLE Weight Bearing: Weight bearing as tolerated      Mobility  Bed Mobility Overal bed mobility: Modified Independent             General bed mobility comments: HOB up, used rail    Transfers Overall transfer level: Needs assistance Equipment used: Rolling walker (2 wheeled) Transfers: Sit to/from Stand Sit to Stand: Min assist         General transfer comment: VCs hand placement, min A to power up  Ambulation/Gait Ambulation/Gait assistance: Min guard Gait Distance (Feet): 50 Feet Assistive device: Rolling walker (2 wheeled) Gait Pattern/deviations: Step-to pattern;Decreased step length - right;Decreased step length - left;Decreased weight shift to left Gait velocity: decr   General Gait Details: VCs sequencing, no loss of balance  Stairs            Wheelchair Mobility    Modified Rankin (Stroke Patients Only)       Balance Overall balance assessment: Needs assistance   Sitting balance-Leahy Scale: Good      Standing balance support: Bilateral upper extremity supported Standing balance-Leahy Scale: Poor Standing balance comment: relies on BUE support                             Pertinent Vitals/Pain Pain Assessment: 0-10 Pain Score: 5  Pain Location: L knee Pain Descriptors / Indicators: Sore Pain Intervention(s): Limited activity within patient's tolerance;Monitored during session;Premedicated before session;Ice applied    Home Living Family/patient expects to be discharged to:: Private residence Living Arrangements: Spouse/significant other Available Help at Discharge: Family;Available 24 hours/day Type of Home: House Home Access: Stairs to enter Entrance Stairs-Rails: None Entrance Stairs-Number of Steps: 1 Home Layout: One level Home Equipment: Walker - 2 wheels      Prior Function Level of Independence: Independent               Hand Dominance        Extremity/Trunk Assessment   Upper Extremity Assessment Upper Extremity Assessment: Overall WFL for tasks assessed    Lower Extremity Assessment Lower Extremity Assessment: LLE deficits/detail LLE Deficits / Details: SLR 3/5, knee AAROM 0-55* LLE Sensation: WNL LLE Coordination: WNL    Cervical / Trunk Assessment Cervical / Trunk Assessment: Normal  Communication   Communication: HOH  Cognition Arousal/Alertness: Awake/alert Behavior During Therapy: WFL for tasks assessed/performed Overall Cognitive Status: Within Functional Limits for tasks assessed  General Comments      Exercises Total Joint Exercises Ankle Circles/Pumps: AROM;Both;10 reps;Supine Heel Slides: AAROM;Left;10 reps;Supine Long Arc Quad: AROM;Left;5 reps;Seated Goniometric ROM: 0-55* AAROM L knee   Assessment/Plan    PT Assessment Patient needs continued PT services  PT Problem List Decreased strength;Decreased mobility;Decreased range of motion;Decreased activity  tolerance;Pain;Decreased knowledge of use of DME       PT Treatment Interventions DME instruction;Gait training;Stair training;Therapeutic exercise;Therapeutic activities;Patient/family education    PT Goals (Current goals can be found in the Care Plan section)  Acute Rehab PT Goals Patient Stated Goal: to walk PT Goal Formulation: With patient/family Time For Goal Achievement: 09/03/20 Potential to Achieve Goals: Good    Frequency 7X/week   Barriers to discharge        Co-evaluation               AM-PAC PT "6 Clicks" Mobility  Outcome Measure Help needed turning from your back to your side while in a flat bed without using bedrails?: A Little Help needed moving from lying on your back to sitting on the side of a flat bed without using bedrails?: A Little Help needed moving to and from a bed to a chair (including a wheelchair)?: A Little Help needed standing up from a chair using your arms (e.g., wheelchair or bedside chair)?: A Little Help needed to walk in hospital room?: A Little Help needed climbing 3-5 steps with a railing? : A Lot 6 Click Score: 17    End of Session Equipment Utilized During Treatment: Gait belt Activity Tolerance: Patient tolerated treatment well Patient left: in chair;with call bell/phone within reach;with chair alarm set;with family/visitor present Nurse Communication: Mobility status PT Visit Diagnosis: Difficulty in walking, not elsewhere classified (R26.2);Muscle weakness (generalized) (M62.81);Pain Pain - Right/Left: Left Pain - part of body: Knee    Time: 9798-9211 PT Time Calculation (min) (ACUTE ONLY): 25 min   Charges:   PT Evaluation $PT Eval Low Complexity: 1 Low PT Treatments $Gait Training: 8-22 mins        Blondell Reveal Kistler PT 08/27/2020  Acute Rehabilitation Services Pager [phone] Office [phone]

## 2020-08-27 NOTE — Transfer of Care (Signed)
Immediate Anesthesia Transfer of Care Note  Patient: Gabriel Mcdonald  Procedure(s) Performed: TOTAL KNEE ARTHROPLASTY (Left Knee)  Patient Location: PACU  Anesthesia Type:Spinal  Level of Consciousness: awake  Airway & Oxygen Therapy: Patient Spontanous Breathing and Patient connected to face mask oxygen  Post-op Assessment: Report given to RN and Post -op Vital signs reviewed and stable  Post vital signs: Reviewed and stable  Last Vitals:  Vitals Value Taken Time  BP    Temp    Pulse    Resp    SpO2      Last Pain:  Vitals:   08/27/20 0627  TempSrc: [place]  PainSc: 0-No pain      Patients Stated Pain Goal: 4 (64/35/39 1225)  Complications: No complications documented.

## 2020-08-27 NOTE — Anesthesia Procedure Notes (Signed)
Anesthesia Regional Block: Adductor canal block   Pre-Anesthetic Checklist: ,, timeout performed, Correct Patient, Correct Site, Correct Laterality, Correct Procedure, Correct Position, site marked, Risks and benefits discussed,  Surgical consent,  Pre-op evaluation,  At surgeon's request and post-op pain management  Laterality: Left  Prep: chloraprep       Needles:  Injection technique: Single-shot  Needle Type: Echogenic Needle     Needle Length: 9cm  Needle Gauge: 21     Additional Needles:   Procedures:,,,, ultrasound used (permanent image in chart),,,,  Narrative:  Start time: 08/27/2020 6:55 AM End time: 08/27/2020 7:01 AM Injection made incrementally with aspirations every 5 mL.  Performed by: Personally  Anesthesiologist: Catalina Gravel, MD  Additional Notes: No pain on injection. No increased resistance to injection. Injection made in 5cc increments.  Good needle visualization.  Patient tolerated procedure well.

## 2020-08-28 ENCOUNTER — Encounter (HOSPITAL_COMMUNITY): Payer: Self-pay | Admitting: Orthopedic Surgery

## 2020-08-28 DIAGNOSIS — D72825 Bandemia: Secondary | ICD-10-CM

## 2020-08-28 DIAGNOSIS — N179 Acute kidney failure, unspecified: Secondary | ICD-10-CM | POA: Diagnosis present

## 2020-08-28 DIAGNOSIS — Z955 Presence of coronary angioplasty implant and graft: Secondary | ICD-10-CM | POA: Diagnosis not present

## 2020-08-28 DIAGNOSIS — D649 Anemia, unspecified: Secondary | ICD-10-CM

## 2020-08-28 DIAGNOSIS — Z7982 Long term (current) use of aspirin: Secondary | ICD-10-CM | POA: Diagnosis not present

## 2020-08-28 DIAGNOSIS — Z8546 Personal history of malignant neoplasm of prostate: Secondary | ICD-10-CM | POA: Diagnosis not present

## 2020-08-28 DIAGNOSIS — M5126 Other intervertebral disc displacement, lumbar region: Secondary | ICD-10-CM | POA: Diagnosis present

## 2020-08-28 DIAGNOSIS — I10 Essential (primary) hypertension: Secondary | ICD-10-CM | POA: Diagnosis not present

## 2020-08-28 DIAGNOSIS — M25762 Osteophyte, left knee: Secondary | ICD-10-CM | POA: Diagnosis present

## 2020-08-28 DIAGNOSIS — K5900 Constipation, unspecified: Secondary | ICD-10-CM | POA: Diagnosis present

## 2020-08-28 DIAGNOSIS — F05 Delirium due to known physiological condition: Secondary | ICD-10-CM | POA: Diagnosis not present

## 2020-08-28 DIAGNOSIS — Z79899 Other long term (current) drug therapy: Secondary | ICD-10-CM | POA: Diagnosis not present

## 2020-08-28 DIAGNOSIS — E7800 Pure hypercholesterolemia, unspecified: Secondary | ICD-10-CM | POA: Diagnosis present

## 2020-08-28 DIAGNOSIS — R001 Bradycardia, unspecified: Secondary | ICD-10-CM | POA: Diagnosis not present

## 2020-08-28 DIAGNOSIS — M1712 Unilateral primary osteoarthritis, left knee: Secondary | ICD-10-CM

## 2020-08-28 DIAGNOSIS — K219 Gastro-esophageal reflux disease without esophagitis: Secondary | ICD-10-CM | POA: Diagnosis present

## 2020-08-28 DIAGNOSIS — Z951 Presence of aortocoronary bypass graft: Secondary | ICD-10-CM

## 2020-08-28 DIAGNOSIS — R410 Disorientation, unspecified: Secondary | ICD-10-CM | POA: Diagnosis not present

## 2020-08-28 DIAGNOSIS — Z77090 Contact with and (suspected) exposure to asbestos: Secondary | ICD-10-CM | POA: Diagnosis present

## 2020-08-28 DIAGNOSIS — T40605A Adverse effect of unspecified narcotics, initial encounter: Secondary | ICD-10-CM | POA: Diagnosis not present

## 2020-08-28 DIAGNOSIS — I2510 Atherosclerotic heart disease of native coronary artery without angina pectoris: Secondary | ICD-10-CM | POA: Diagnosis present

## 2020-08-28 DIAGNOSIS — I440 Atrioventricular block, first degree: Secondary | ICD-10-CM

## 2020-08-28 DIAGNOSIS — N1831 Chronic kidney disease, stage 3a: Secondary | ICD-10-CM | POA: Diagnosis present

## 2020-08-28 DIAGNOSIS — J449 Chronic obstructive pulmonary disease, unspecified: Secondary | ICD-10-CM | POA: Diagnosis present

## 2020-08-28 DIAGNOSIS — Z781 Physical restraint status: Secondary | ICD-10-CM | POA: Diagnosis not present

## 2020-08-28 DIAGNOSIS — Z87442 Personal history of urinary calculi: Secondary | ICD-10-CM | POA: Diagnosis not present

## 2020-08-28 DIAGNOSIS — I129 Hypertensive chronic kidney disease with stage 1 through stage 4 chronic kidney disease, or unspecified chronic kidney disease: Secondary | ICD-10-CM | POA: Diagnosis present

## 2020-08-28 DIAGNOSIS — Y92230 Patient room in hospital as the place of occurrence of the external cause: Secondary | ICD-10-CM | POA: Diagnosis not present

## 2020-08-28 DIAGNOSIS — I959 Hypotension, unspecified: Secondary | ICD-10-CM

## 2020-08-28 DIAGNOSIS — M170 Bilateral primary osteoarthritis of knee: Secondary | ICD-10-CM | POA: Diagnosis present

## 2020-08-28 LAB — PHOSPHORUS: Phosphorus: 2.3 mg/dL — ABNORMAL LOW (ref 2.5–4.6)

## 2020-08-28 LAB — MAGNESIUM: Magnesium: 1.7 mg/dL (ref 1.7–2.4)

## 2020-08-28 LAB — COMPREHENSIVE METABOLIC PANEL
ALT: 13 U/L (ref 0–44)
AST: 21 U/L (ref 15–41)
Albumin: 3.5 g/dL (ref 3.5–5.0)
Alkaline Phosphatase: 54 U/L (ref 38–126)
Anion gap: 8 (ref 5–15)
BUN: 18 mg/dL (ref 8–23)
CO2: 23 mmol/L (ref 22–32)
Calcium: 9.3 mg/dL (ref 8.9–10.3)
Chloride: 105 mmol/L (ref 98–111)
Creatinine, Ser: 1.22 mg/dL (ref 0.61–1.24)
GFR, Estimated: 58 mL/min — ABNORMAL LOW (ref >60)
Glucose, Bld: 140 mg/dL — ABNORMAL HIGH (ref 70–99)
Potassium: 4.1 mmol/L (ref 3.5–5.1)
Sodium: 136 mmol/L (ref 135–145)
Total Bilirubin: 0.7 mg/dL (ref 0.3–1.2)
Total Protein: 6.1 g/dL — ABNORMAL LOW (ref 6.5–8.1)

## 2020-08-28 LAB — CBC
HCT: 32.1 % — ABNORMAL LOW (ref 39.0–52.0)
Hemoglobin: 11.1 g/dL — ABNORMAL LOW (ref 13.0–17.0)
MCH: 31.8 pg (ref 26.0–34.0)
MCHC: 34.6 g/dL (ref 30.0–36.0)
MCV: 92 fL (ref 80.0–100.0)
Platelets: 206 10*3/uL (ref 150–400)
RBC: 3.49 MIL/uL — ABNORMAL LOW (ref 4.22–5.81)
RDW: 14.1 % (ref 11.5–15.5)
WBC: 12.2 10*3/uL — ABNORMAL HIGH (ref 4.0–10.5)
nRBC: 0 % (ref 0.0–0.2)

## 2020-08-28 MED ORDER — TRAMADOL HCL 50 MG PO TABS
50.0000 mg | ORAL_TABLET | Freq: Three times a day (TID) | ORAL | Status: DC | PRN
  Administered 2020-08-29: 50 mg via ORAL
  Filled 2020-08-28: qty 1

## 2020-08-28 MED ORDER — TRAMADOL HCL 50 MG PO TABS: 100.0000 mg | ORAL_TABLET | Freq: Once | ORAL | Status: DC

## 2020-08-28 MED ORDER — POLYETHYLENE GLYCOL 3350 17 G PO PACK: 17.0000 g | PACK | Freq: Two times a day (BID) | ORAL | Status: DC | PRN

## 2020-08-28 MED ORDER — LORAZEPAM 1 MG PO TABS: 1.0000 mg | ORAL_TABLET | Freq: Once | ORAL | Status: AC

## 2020-08-28 MED ORDER — THEOPHYLLINE ER 400 MG PO TB24
400.0000 mg | ORAL_TABLET | Freq: Every morning | ORAL | Status: DC
  Administered 2020-08-28 – 2020-08-29 (×2): 400 mg via ORAL
  Filled 2020-08-28 (×2): qty 1

## 2020-08-28 MED ORDER — SENNOSIDES-DOCUSATE SODIUM 8.6-50 MG PO TABS: 1.0000 | ORAL_TABLET | Freq: Two times a day (BID) | ORAL | Status: DC | PRN

## 2020-08-28 MED ORDER — BENZTROPINE MESYLATE 0.5 MG PO TABS: 1.0000 mg | ORAL_TABLET | Freq: Two times a day (BID) | ORAL | Status: DC | PRN

## 2020-08-28 MED ORDER — HALOPERIDOL LACTATE 5 MG/ML IJ SOLN
5.0000 mg | Freq: Four times a day (QID) | INTRAMUSCULAR | Status: DC | PRN
  Administered 2020-08-28: 5 mg via INTRAMUSCULAR
  Filled 2020-08-28: qty 1

## 2020-08-28 MED ORDER — BENZTROPINE MESYLATE 1 MG/ML IJ SOLN
1.0000 mg | Freq: Two times a day (BID) | INTRAMUSCULAR | Status: DC | PRN
  Filled 2020-08-28: qty 1

## 2020-08-28 MED ORDER — BENZTROPINE MESYLATE 0.5 MG PO TABS: 1.0000 mg | ORAL_TABLET | Freq: Four times a day (QID) | ORAL | Status: DC | PRN

## 2020-08-28 MED ORDER — ACETAMINOPHEN 500 MG PO TABS
1000.0000 mg | ORAL_TABLET | Freq: Three times a day (TID) | ORAL | Status: AC
  Administered 2020-08-28 – 2020-08-29 (×4): 1000 mg via ORAL
  Filled 2020-08-28 (×6): qty 2

## 2020-08-28 MED ORDER — LORAZEPAM 2 MG/ML IJ SOLN
1.0000 mg | Freq: Once | INTRAMUSCULAR | Status: AC
  Administered 2020-08-28: 1 mg via INTRAMUSCULAR

## 2020-08-28 MED ORDER — HALOPERIDOL 5 MG PO TABS
5.0000 mg | ORAL_TABLET | Freq: Four times a day (QID) | ORAL | Status: DC | PRN
  Filled 2020-08-28: qty 1

## 2020-08-28 MED ORDER — METOPROLOL SUCCINATE ER 25 MG PO TB24
25.0000 mg | ORAL_TABLET | ORAL | Status: DC
  Administered 2020-08-29: 25 mg via ORAL
  Filled 2020-08-28: qty 1

## 2020-08-28 MED ORDER — LORAZEPAM 2 MG/ML IJ SOLN
INTRAMUSCULAR | Status: AC
  Filled 2020-08-28: qty 1

## 2020-08-28 MED ORDER — MAGNESIUM CITRATE PO SOLN: 1.0000 | Freq: Every day | ORAL | Status: DC | PRN
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
## 2020-08-28 NOTE — Progress Notes (Signed)
Chaplain responded to request to offer support for the patient and family as patient had knee replacement and is experiencing confusion and agitation.  Chaplin provided ministry of presence and allowed space for patient and family to share some about their lives in [place].  Patient really enjoys talking about his yard and tractor and these distractions provided space for the patient to calm some and take his mind off current situation.  Chaplain prayed with the family.  Chaplain will pass off to day chaplains. Early, Mdiv.    08/28/20 [phone]  Clinical Encounter Type  Visited With Patient and family together  Visit Type Spiritual support;Psychological support  Referral From Nurse  Consult/Referral To Chaplain  Spiritual Encounters  Spiritual Needs Prayer  Stress Factors  Family Stress Factors Health changes
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
## 2020-08-28 NOTE — TOC Transition Note (Signed)
Transition of Care [facility]) - CM/SW Discharge Note   Patient Details  Name: Jan W [name] MRN: [id number] Date of Birth: Jun 23, 1931  Transition of Care [facility]) CM/SW Contact:  Lennart Pall, LCSW Phone Number: 08/28/2020, 9:59 AM   Clinical Narrative:    Met with pt and daughter today and confirming pt has received rolling walker via [place].  Plan OPPT via [facility].  No TOC needs.   Final next level of care: OP Rehab Barriers to Discharge: No Barriers Identified   Patient Goals and CMS Choice Patient states their goals for this hospitalization and ongoing recovery are:: return home      Discharge Placement                       Discharge Plan and Services                DME Arranged: Walker rolling DME Agency: [place]                  Social Determinants of Health (SDOH) Interventions     Readmission Risk Interventions No flowsheet data found.

## 2020-08-28 NOTE — Progress Notes (Signed)
Restraints never placed. Moved patient's room closer to the nurses station.   Patient's wife also called to the patient's bedside.   Patient is able to be consoled by wife, and frequent staff interactions, approximately every 20-30 minutes.    Delirium and fall precautions continued.    Will continue to monitor patient.     [place], Therapist, sports

## 2020-08-28 NOTE — Progress Notes (Signed)
Patient confused, aggressive verbally and physically and also climbing out of bed. Security called.

## 2020-08-28 NOTE — Progress Notes (Signed)
Patient's significant other at bedside to calm patient and help with compliance of care.
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
## 2020-08-29 DIAGNOSIS — F05 Delirium due to known physiological condition: Secondary | ICD-10-CM

## 2020-08-29 LAB — RENAL FUNCTION PANEL
Albumin: 3.6 g/dL (ref 3.5–5.0)
Anion gap: 9 (ref 5–15)
BUN: 16 mg/dL (ref 8–23)
CO2: 23 mmol/L (ref 22–32)
Calcium: 9 mg/dL (ref 8.9–10.3)
Chloride: 102 mmol/L (ref 98–111)
Creatinine, Ser: 0.97 mg/dL (ref 0.61–1.24)
GFR, Estimated: >60 mL/min (ref >60)
Glucose, Bld: 117 mg/dL — ABNORMAL HIGH (ref 70–99)
Phosphorus: 2.4 mg/dL — ABNORMAL LOW (ref 2.5–4.6)
Potassium: 3.9 mmol/L (ref 3.5–5.1)
Sodium: 134 mmol/L — ABNORMAL LOW (ref 135–145)

## 2020-08-29 LAB — MAGNESIUM: Magnesium: 1.7 mg/dL (ref 1.7–2.4)

## 2020-08-29 LAB — CBC
HCT: 31.6 % — ABNORMAL LOW (ref 39.0–52.0)
Hemoglobin: 10.9 g/dL — ABNORMAL LOW (ref 13.0–17.0)
MCH: 31.6 pg (ref 26.0–34.0)
MCHC: 34.5 g/dL (ref 30.0–36.0)
MCV: 91.6 fL (ref 80.0–100.0)
Platelets: 193 10*3/uL (ref 150–400)
RBC: 3.45 MIL/uL — ABNORMAL LOW (ref 4.22–5.81)
RDW: 14.3 % (ref 11.5–15.5)
WBC: 10.9 10*3/uL — ABNORMAL HIGH (ref 4.0–10.5)
nRBC: 0 % (ref 0.0–0.2)

## 2020-08-29 MED ORDER — RIVAROXABAN 10 MG PO TABS: 10.0000 mg | ORAL_TABLET | Freq: Every day | ORAL | 0 refills | Status: DC

## 2020-08-29 MED ORDER — METHOCARBAMOL 500 MG PO TABS: 500.0000 mg | ORAL_TABLET | Freq: Four times a day (QID) | ORAL | 0 refills | Status: DC | PRN

## 2020-08-29 MED ORDER — POLYETHYLENE GLYCOL 3350 17 G PO PACK
17.0000 g | PACK | Freq: Every day | ORAL | Status: DC
  Administered 2020-08-29: 17 g via ORAL
  Filled 2020-08-29: qty 1

## 2020-08-29 MED ORDER — ACETAMINOPHEN 500 MG PO TABS: 1000.0000 mg | ORAL_TABLET | Freq: Three times a day (TID) | ORAL | Status: DC | PRN

## 2020-08-29 MED ORDER — ACETAMINOPHEN 500 MG PO TABS: 500.0000 mg | ORAL_TABLET | Freq: Three times a day (TID) | ORAL | 0 refills | Status: AC | PRN

## 2020-08-29 MED ORDER — MELATONIN 3 MG PO TABS: 3.0000 mg | ORAL_TABLET | Freq: Every day | ORAL | Status: DC

## 2020-08-29 NOTE — Progress Notes (Signed)
Physical Therapy Treatment Patient Details Name: Gabriel Mcdonald MRN: [id number] DOB: [birth year] Today's Date: 08/29/2020    History of Present Illness 87 y.o. male admitted 08/27/20 for L TKA. PMH of L4-5 discectomy, CAD, prostate ca, COPD, CABG.    PT Comments    Pt is pleasant, oriented to self and to year but not to month nor situation. He ambulated 140' with RW, no loss of balance. Performed TKA exercises with min assist. No delirium nor combativeness noted during this session.   Follow Up Recommendations  Follow surgeon's recommendation for DC plan and follow-up therapies;Outpatient PT     Equipment Recommendations  None recommended by PT    Recommendations for Other Services       Precautions / Restrictions Precautions Precautions: Knee Precaution Booklet Issued: Yes (comment) Precaution Comments: reviewed no pillow under knee Restrictions Weight Bearing Restrictions: No LLE Weight Bearing: Weight bearing as tolerated    Mobility  Bed Mobility               General bed mobility comments: sitting up on edge of bed    Transfers Overall transfer level: Needs assistance Equipment used: Rolling walker (2 wheeled) Transfers: Sit to/from Stand Sit to Stand: Min guard         General transfer comment: VCs and manual cues for hand placement, min/guard for safety  Ambulation/Gait Ambulation/Gait assistance: Min guard Gait Distance (Feet): 140 Feet Assistive device: Rolling walker (2 wheeled) Gait Pattern/deviations: Step-to pattern;Decreased step length - right;Decreased step length - left;Decreased weight shift to left Gait velocity: decr   General Gait Details: steady, no loss of balance   Stairs             Wheelchair Mobility    Modified Rankin (Stroke Patients Only)       Balance Overall balance assessment: Needs assistance   Sitting balance-Leahy Scale: Good     Standing balance support: Bilateral upper extremity supported Standing  balance-Leahy Scale: Poor Standing balance comment: relies on BUE support                            Cognition Arousal/Alertness: Awake/alert Behavior During Therapy: WFL for tasks assessed/performed Overall Cognitive Status: Impaired/Different from baseline Area of Impairment: Orientation;Memory                 Orientation Level: Time;Situation   Memory: Decreased short-term memory         General Comments: delirium/combative yesterday afternoon, narcotics on hold, oriented to self and to year but not to month nor situation      Exercises Total Joint Exercises Ankle Circles/Pumps: AROM;Both;10 reps;Supine Short Arc Quad: AROM;Left;10 reps;Supine Heel Slides: AAROM;Left;10 reps;Supine Hip ABduction/ADduction: AAROM;Left;Supine;10 reps Long Arc Quad: AAROM;Left;10 reps;Seated Knee Flexion: AAROM;Left;10 reps;Seated Goniometric ROM: 0-70* AAROM L knee    General Comments        Pertinent Vitals/Pain Faces Pain Scale: Hurts little more Pain Location: L knee Pain Descriptors / Indicators: Sore Pain Intervention(s): Limited activity within patient's tolerance;Monitored during session;Premedicated before session;Ice applied    Home Living                      Prior Function            PT Goals (current goals can now be found in the care plan section) Acute Rehab PT Goals Patient Stated Goal: to walk PT Goal Formulation: With patient/family Time For Goal Achievement: 09/03/20 Potential to Achieve  Goals: Good Progress towards PT goals: Progressing toward goals    Frequency    7X/week      PT Plan Current plan remains appropriate    Co-evaluation              AM-PAC PT "6 Clicks" Mobility   Outcome Measure  Help needed turning from your back to your side while in a flat bed without using bedrails?: A Little Help needed moving from lying on your back to sitting on the side of a flat bed without using bedrails?: A  Little Help needed moving to and from a bed to a chair (including a wheelchair)?: A Little Help needed standing up from a chair using your arms (e.g., wheelchair or bedside chair)?: A Little Help needed to walk in hospital room?: A Little Help needed climbing 3-5 steps with a railing? : A Lot 6 Click Score: 17    End of Session Equipment Utilized During Treatment: Gait belt Activity Tolerance: Patient tolerated treatment well Patient left: in chair;with call bell/phone within reach;with chair alarm set;with family/visitor present;with nursing/sitter in room Nurse Communication: Mobility status PT Visit Diagnosis: Difficulty in walking, not elsewhere classified (R26.2);Muscle weakness (generalized) (M62.81);Pain Pain - Right/Left: Left Pain - part of body: Knee     Time: 6160-7371 PT Time Calculation (min) (ACUTE ONLY): 15 min  Charges:  $Gait Training: 8-22 mins                     Blondell Reveal Kistler PT 08/29/2020  Acute Rehabilitation Services Pager [phone] Office [phone]

## 2020-08-29 NOTE — Plan of Care (Signed)
?  Problem: Coping: ?Goal: Level of anxiety will decrease ?Outcome: Progressing ?  ?Problem: Safety: ?Goal: Ability to remain free from injury will improve ?Outcome: Progressing ?  ?

## 2020-08-29 NOTE — Progress Notes (Signed)
Patient discharged to home w/ SO. SO verbalized understanding of all instructions. Home w/ belongings, instructions, equipment. Escorted to pov via w/c.

## 2020-08-29 NOTE — Progress Notes (Signed)
PT Cancellation Note  Patient Details Name: Gabriel Mcdonald MRN: [id number] DOB: Oct 28, 1931   Cancelled Treatment:    Reason Eval/Treat Not Completed: Fatigue/lethargy limiting ability to participate (pt sleeping very soundly x 2 attempts this morning. Per significant other, he didn't sleep well last night and she'd like him to be able to rest right now. Will attempt again this afternoon.)   Philomena Doheny PT 08/29/2020  Acute Rehabilitation Services Pager [phone] Office [phone]

## 2020-08-29 NOTE — Progress Notes (Addendum)
   Subjective: 2 Days Post-Op Procedure(s) (LRB): TOTAL KNEE ARTHROPLASTY (Left) Patient reports pain as moderate.  Patient seen in rounds with Dr. Wynelle Link. Patient is doing much better today. Ambulating from bathroom to bed with walker. Less confusion this AM, reports his knee is hurting. Moving knee well.  Objective: Vital signs in last 24 hours: Temp:  [98.4 F (36.9 C)-98.5 F (36.9 C)] 98.4 F (36.9 C) (04/06 0522) Pulse Rate:  [53-105] 105 (04/06 0522) Resp:  [18-19] 19 (04/06 0522) BP: (119-170)/(50-74) 170/74 (04/06 0522) SpO2:  [97 %-100 %] 99 % (04/06 0522)  Intake/Output from previous day:  Intake/Output Summary (Last 24 hours) at 08/29/2020 0821 Last data filed at 08/29/2020 0600 Gross per 24 hour  Intake 551.25 ml  Output --  Net 551.25 ml    Intake/Output this shift: No intake/output data recorded.  Labs: Recent Labs    08/28/20 1121 08/29/20 0259  HGB 11.1* 10.9*   Recent Labs    08/28/20 1121 08/29/20 0259  WBC 12.2* 10.9*  RBC 3.49* 3.45*  HCT 32.1* 31.6*  PLT 206 193   Recent Labs    08/28/20 1121 08/29/20 0259  NA 136 134*  K 4.1 3.9  CL 105 102  CO2 23 23  BUN 18 16  CREATININE 1.22 0.97  GLUCOSE 140* 117*  CALCIUM 9.3 9.0   No results for input(s): LABPT, INR in the last 72 hours.  Exam: General - Patient is Alert and Appropriate Extremity - Neurologically intact Neurovascular intact Sensation intact distally Dorsiflexion/Plantar flexion intact Dressing/Incision - clean, dry, no drainage. Aquacel in place.  Motor Function - intact, moving foot and toes well on exam.   Past Medical History:  Diagnosis Date  . Cancer [facility])    prostate  . Cataract   . COPD (chronic obstructive pulmonary disease) (HCC)    Pleural thickening per pt, pt reports exposure to asbestos  . Coronary artery disease   . GERD (gastroesophageal reflux disease)    mild  . High cholesterol   . Hypertension   . Shortness of breath      Assessment/Plan: 2 Days Post-Op Procedure(s) (LRB): TOTAL KNEE ARTHROPLASTY (Left) Active Problems:   Primary osteoarthritis of left knee  Estimated body mass index is 25.93 kg/m as calculated from the following:   Height as of this encounter: 6' (1.829 m).   Weight as of this encounter: 86.7 kg. Up with therapy  DVT Prophylaxis - Xarelto Weight-bearing as tolerated  Will attempt to work with therapy today, improved cognitively from rounds yesterday. Will need continued time prior to discharge. Possibly could go home tomorrow, but will need to ensure he is at his mental baseline prior.   Very appreciative of the hospitalist service and their assistance with Mr. [name].   Theresa Duty, PA-C Orthopedic Surgery [phone] 08/29/2020, 8:21 AM

## 2020-08-29 NOTE — Progress Notes (Signed)
[facility] CONSULTATION PROGRESS NOTE    Progress Note  Gabriel Mcdonald  NWG:[id number] DOB: 06/07/1931 DOA: 08/27/2020 PCP: Neale Burly, MD     Brief Narrative:   [name] is an 87 y.o. male past medical history of CAD/CABG in 62, prostate cancer status post prostatectomy in 2011, history of bilateral knee osteoarthritis admitted by orthopedic surgery for left knee arthroplasty on 08/27/2020 overnight the patient developed delirium and agitation so we are consulted for management of his acute confusional state   Assessment/Plan:   Left knee osteoarthritis status post total knee arthroplasty on 08/27/2020: To primary team, continue narcotics try to minimize as much as possible. Xarelto for DVT prophylaxis.  Acute confusional state: Likely iatrogenic in the setting of advanced age and narcotics use. Minimize sedatives, reorient, try to keep blinds open during the day and closed at night avoid telemetry. Start him on melatonin at night, Haldol IV as needed avoid benzodiazepines. MiraLAX p.o. daily. Start on melatonin and Seroquel at night.  Haldol IV as needed.  Acute kidney injury on chronic kidney disease stage IIIa: Likely prerenal azotemia resolved with IV fluids.  Essential hypertension/hypertension: Holding home dose of amlodipine, currently on metoprolol.  Sinus bradycardia/first-degree AV block: His metoprolol dose was continue current regimen.  Mild leukocytosis: Likely due to stress demargination due to surgery.  Normocytic anemia: Hemoglobin stable continue to monitor intermittently.  RN Pressure Injury Documentation:       DVT prophylaxis: Xarelto Family Communication:wife Status is: Inpatient  Remains inpatient appropriate because:Hemodynamically unstable   Dispo: The patient is from: Home              Anticipated d/c is to: Home              Patient currently is not medically stable to d/c.   Difficult to place patient  No        Code Status:     Code Status Orders  (From admission, onward)         Start     Ordered   08/27/20 1056  Full code  Continuous        08/27/20 1055        Code Status History    Date Active Date Inactive Code Status Order ID Comments User Context   05/03/2013 1729 05/04/2013 1551 Full Code [id number]  Floyce Stakes, MD Inpatient   Advance Care Planning Activity    Advance Directive Documentation   Flowsheet Row Most Recent Value  Type of Advance Directive Healthcare Power of Attorney, Living will  Pre-existing out of facility DNR order (yellow form or pink MOST form) --  "MOST" Form in Place? --        IV Access:    Peripheral IV   Procedures and diagnostic studies:   No results found.   Medical Consultants:    None.   Subjective:    Gabriel Mcdonald more awake today able to answer simple questions and relates no pain.  Objective:    Vitals:   08/28/20 0511 08/28/20 0944 08/28/20 2037 08/29/20 0522  BP: (!) 145/30 (!) 119/50 (!) 164/53 (!) 170/74  Pulse: (!) 54 (!) 53 75 (!) 105  Resp: 16 18 18 19   Temp: 98.4 F (36.9 C)  98.5 F (36.9 C) 98.4 F (36.9 C)  TempSrc: Oral  Axillary Oral  SpO2: 100% 100% 97% 99%  Weight:      Height:       SpO2: 99 % O2 Flow  Rate (L/min): 5 L/min   Intake/Output Summary (Last 24 hours) at 08/29/2020 0916 Last data filed at 08/29/2020 0600 Gross per 24 hour  Intake 551.25 ml  Output --  Net 551.25 ml   Filed Weights   08/27/20 0627  Weight: 86.7 kg    Exam: General exam: In no acute distress. Respiratory system: Good air movement and clear to auscultation. Cardiovascular system: S1 & S2 heard, RRR.  Gastrointestinal system: Abdomen is nondistended, soft and nontender.  Extremities: No pedal edema. Skin: No rashes, lesions or ulcers Psychiatry: Some insight of medical condition . Mood & affect appropriate.    Data Reviewed:    Labs: Basic Metabolic Panel: Recent Labs  Lab  08/28/20 1121 08/29/20 0259  NA 136 134*  K 4.1 3.9  CL 105 102  CO2 23 23  GLUCOSE 140* 117*  BUN 18 16  CREATININE 1.22 0.97  CALCIUM 9.3 9.0  MG 1.7 1.7  PHOS 2.3* 2.4*   GFR Estimated Creatinine Clearance: 60 mL/min (by C-G formula based on SCr of 0.97 mg/dL). Liver Function Tests: Recent Labs  Lab 08/28/20 1121 08/29/20 0259  AST 21  --   ALT 13  --   ALKPHOS 54  --   BILITOT 0.7  --   PROT 6.1*  --   ALBUMIN 3.5 3.6   No results for input(s): LIPASE, AMYLASE in the last 168 hours. No results for input(s): AMMONIA in the last 168 hours. Coagulation profile No results for input(s): INR, PROTIME in the last 168 hours. COVID-19 Labs  No results for input(s): DDIMER, FERRITIN, LDH, CRP in the last 72 hours.  Lab Results  Component Value Date   SARSCOV2NAA NEGATIVE 08/24/2020   [place] NEGATIVE 05/10/2020    CBC: Recent Labs  Lab 08/28/20 1121 08/29/20 0259  WBC 12.2* 10.9*  HGB 11.1* 10.9*  HCT 32.1* 31.6*  MCV 92.0 91.6  PLT 206 193   Cardiac Enzymes: No results for input(s): CKTOTAL, CKMB, CKMBINDEX, TROPONINI in the last 168 hours. BNP (last 3 results) No results for input(s): PROBNP in the last 8760 hours. CBG: No results for input(s): GLUCAP in the last 168 hours. D-Dimer: No results for input(s): DDIMER in the last 72 hours. Hgb A1c: No results for input(s): HGBA1C in the last 72 hours. Lipid Profile: No results for input(s): CHOL, HDL, LDLCALC, TRIG, CHOLHDL, LDLDIRECT in the last 72 hours. Thyroid function studies: No results for input(s): TSH, T4TOTAL, T3FREE, THYROIDAB in the last 72 hours.  Invalid input(s): FREET3 Anemia work up: No results for input(s): VITAMINB12, FOLATE, FERRITIN, TIBC, IRON, RETICCTPCT in the last 72 hours. Sepsis Labs: Recent Labs  Lab 08/28/20 1121 08/29/20 0259  WBC 12.2* 10.9*   Microbiology Recent Results (from the past 240 hour(s))  Surgical pcr screen     Status: Abnormal   Collection Time:  08/21/20  9:29 AM   Specimen: Nasal Mucosa; Nasal Swab  Result Value Ref Range Status   MRSA, PCR NEGATIVE NEGATIVE Final   Staphylococcus aureus POSITIVE (A) NEGATIVE Final    Comment: (NOTE) The Xpert SA Assay (FDA approved for NASAL specimens in patients 50 years of age and older), is one component of a comprehensive surveillance program. It is not intended to diagnose infection nor to guide or monitor treatment. Performed at [facility], [place] [address], [place], [place] [address]   SARS CORONAVIRUS 2 (TAT 6-24 HRS) Nasopharyngeal Nasopharyngeal Swab     Status: None   Collection Time: 08/24/20  9:16 AM  Specimen: Nasopharyngeal Swab  Result Value Ref Range Status   SARS Coronavirus 2 NEGATIVE NEGATIVE Final    Comment: (NOTE) SARS-CoV-2 target nucleic acids are NOT DETECTED.  The SARS-CoV-2 RNA is generally detectable in upper and lower respiratory specimens during the acute phase of infection. Negative results do not preclude SARS-CoV-2 infection, do not rule out co-infections with other pathogens, and should not be used as the sole basis for treatment or other patient management decisions. Negative results must be combined with clinical observations, patient history, and epidemiological information. The expected result is Negative.  Fact Sheet for Patients: SugarRoll.be  Fact Sheet for Healthcare Providers: https://www.woods-mathews.com/  This test is not yet approved or cleared by the [place] FDA and  has been authorized for detection and/or diagnosis of SARS-CoV-2 by FDA under an Emergency Use Authorization (EUA). This EUA will remain  in effect (meaning this test can be used) for the duration of the COVID-19 declaration under Se ction 564(b)(1) of the Act, 21 U.S.C. section 360bbb-3(b)(1), unless the authorization is terminated or revoked sooner.  Performed at [facility], [place] [address], [place], [place] [address]      Medications:   . acetaminophen  1,000 mg Oral Q8H  . atorvastatin  10 mg Oral QPM  . dexamethasone (DECADRON) injection  10 mg Intravenous Once  . docusate sodium  100 mg Oral BID  . ferrous sulfate  325 mg Oral BID WC  . metoprolol succinate  25 mg Oral QODAY  . pantoprazole  80 mg Oral Daily  . rivaroxaban  10 mg Oral Q breakfast  . tamsulosin  0.4 mg Oral QPM  . theophylline  400 mg Oral q morning  . traMADol  100 mg Oral Once   Continuous Infusions: . sodium chloride Stopped (08/27/20 2121)  . methocarbamol (ROBAXIN) IV        LOS: 1 day   Charlynne Cousins  [facility]  08/29/2020, 9:16 AM

## 2020-08-29 NOTE — Progress Notes (Signed)
Physical Therapy Treatment Patient Details Name: Gabriel Mcdonald [name] MRN: [id number] DOB: Oct 31, 1931 Today's Date: 08/29/2020    History of Present Illness 87 y.o. male admitted 08/27/20 for L TKA. Post op delirium/combativeness night of 08/27/20 and afternoon 08/28/20. PMH of L4-5 discectomy, CAD, prostate ca, COPD, CABG.    PT Comments    Pt is calm and oriented to month/year and location. He ambulated 350' with RW, no loss of balance. Pt is progressing well with mobility.   Follow Up Recommendations  Follow surgeon's recommendation for DC plan and follow-up therapies;Outpatient PT     Equipment Recommendations  None recommended by PT    Recommendations for Other Services       Precautions / Restrictions Precautions Precautions: Knee Precaution Booklet Issued: Yes (comment) Precaution Comments: reviewed no pillow under knee with pt and Butch Penny his SO Restrictions Weight Bearing Restrictions: No LLE Weight Bearing: Weight bearing as tolerated    Mobility  Bed Mobility               General bed mobility comments: sitting up on edge of bed    Transfers Overall transfer level: Needs assistance Equipment used: Rolling walker (2 wheeled) Transfers: Sit to/from Stand Sit to Stand: Min guard         General transfer comment: VCs and manual cues for hand placement, min/guard for safety  Ambulation/Gait Ambulation/Gait assistance: Min guard Gait Distance (Feet): 350 Feet Assistive device: Rolling walker (2 wheeled) Gait Pattern/deviations: Decreased step length - right;Decreased step length - left;Decreased weight shift to left;Step-through pattern Gait velocity: decr   General Gait Details: steady, no loss of balance   Stairs             Wheelchair Mobility    Modified Rankin (Stroke Patients Only)       Balance Overall balance assessment: Needs assistance   Sitting balance-Leahy Scale: Good     Standing balance support: Bilateral upper extremity  supported Standing balance-Leahy Scale: Fair Standing balance comment: relies on BUE support                            Cognition Arousal/Alertness: Awake/alert Behavior During Therapy: WFL for tasks assessed/performed Overall Cognitive Status: Within Functional Limits for tasks assessed Area of Impairment: Orientation;Memory                 Orientation Level: Time;Situation   Memory: Decreased short-term memory         General Comments: delirium/combative yesterday afternoon and evening of 08/27/20, narcotics on hold, oriented to month/year and to location this afternoon      Exercises Total Joint Exercises  Long Arc Quad: [place];Left;10 reps;Seated Knee Flexion: AAROM;Left;10 reps;Seated Goniometric ROM: 0-70* AAROM L knee    General Comments        Pertinent Vitals/Pain Faces Pain Scale: Hurts a little bit Pain Location: L knee Pain Descriptors / Indicators: Sore Pain Intervention(s): Limited activity within patient's tolerance;Monitored during session;Premedicated before session;Ice applied    Home Living                      Prior Function            PT Goals (current goals can now be found in the care plan section) Acute Rehab PT Goals Patient Stated Goal: to walk PT Goal Formulation: With patient/family Time For Goal Achievement: 09/03/20 Potential to Achieve Goals: Good Progress towards PT goals: Progressing toward goals  Frequency    7X/week      PT Plan Current plan remains appropriate    Co-evaluation              AM-PAC PT "6 Clicks" Mobility   Outcome Measure  Help needed turning from your back to your side while in a flat bed without using bedrails?: A Little Help needed moving from lying on your back to sitting on the side of a flat bed without using bedrails?: A Little Help needed moving to and from a bed to a chair (including a wheelchair)?: A Little Help needed standing up from a chair using your  arms (e.g., wheelchair or bedside chair)?: A Little Help needed to walk in hospital room?: A Little Help needed climbing 3-5 steps with a railing? : A Little 6 Click Score: 18    End of Session Equipment Utilized During Treatment: Gait belt Activity Tolerance: Patient tolerated treatment well Patient left: in chair;with call bell/phone within reach;with family/visitor present;with nursing/sitter in room Nurse Communication: Mobility status PT Visit Diagnosis: Difficulty in walking, not elsewhere classified (R26.2);Muscle weakness (generalized) (M62.81);Pain Pain - Right/Left: Left Pain - part of body: Knee     Time: 5681-2751 PT Time Calculation (min) (ACUTE ONLY): 20 min  Charges:  $Gait Training: 8-22 mins                     Blondell Reveal Kistler PT 08/29/2020  Acute Rehabilitation Services Pager [phone] Office [phone]

## 2020-08-30 ENCOUNTER — Encounter (HOSPITAL_COMMUNITY): Payer: Medicare Other

## 2020-08-30 LAB — TYPE AND SCREEN
ABO/RH(D): O POS
Antibody Screen: POSITIVE
Donor AG Type: NEGATIVE
Donor AG Type: NEGATIVE
Unit division: 0
Unit division: 0

## 2020-08-30 LAB — BPAM RBC
Blood Product Expiration Date: 202204142359
Blood Product Expiration Date: 202204162359
Unit Type and Rh: 5100
Unit Type and Rh: 5100

## 2020-08-31 DIAGNOSIS — M25561 Pain in right knee: Secondary | ICD-10-CM | POA: Diagnosis not present

## 2020-08-31 DIAGNOSIS — M6281 Muscle weakness (generalized): Secondary | ICD-10-CM | POA: Diagnosis not present

## 2020-08-31 DIAGNOSIS — Z471 Aftercare following joint replacement surgery: Secondary | ICD-10-CM | POA: Diagnosis not present

## 2020-09-03 DIAGNOSIS — M6281 Muscle weakness (generalized): Secondary | ICD-10-CM | POA: Diagnosis not present

## 2020-09-03 DIAGNOSIS — M25561 Pain in right knee: Secondary | ICD-10-CM | POA: Diagnosis not present

## 2020-09-03 DIAGNOSIS — Z471 Aftercare following joint replacement surgery: Secondary | ICD-10-CM | POA: Diagnosis not present

## 2020-09-05 DIAGNOSIS — M6281 Muscle weakness (generalized): Secondary | ICD-10-CM | POA: Diagnosis not present

## 2020-09-05 DIAGNOSIS — M25561 Pain in right knee: Secondary | ICD-10-CM | POA: Diagnosis not present

## 2020-09-05 DIAGNOSIS — Z471 Aftercare following joint replacement surgery: Secondary | ICD-10-CM | POA: Diagnosis not present

## 2020-09-05 NOTE — Discharge Summary (Signed)
Physician Discharge Summary   Patient ID: Gabriel Mcdonald MRN: [id number] DOB/AGE: Jun 01, 1931 87 y.o.  Admit date: 08/27/2020 Discharge date: 08/29/2020  Primary Diagnosis: Osteoarthritis, left knee   Admission Diagnoses:  Past Medical History:  Diagnosis Date  . Cancer [facility])    prostate  . Cataract   . COPD (chronic obstructive pulmonary disease) (HCC)    Pleural thickening per pt, pt reports exposure to asbestos  . Coronary artery disease   . GERD (gastroesophageal reflux disease)    mild  . High cholesterol   . Hypertension   . Shortness of breath    Discharge Diagnoses:   Active Problems:   Primary osteoarthritis of left knee   Acute confusional state  Estimated body mass index is 25.93 kg/m as calculated from the following:   Height as of this encounter: 6' (1.829 m).   Weight as of this encounter: 86.7 kg.  Procedure:  Procedure(s) (LRB): TOTAL KNEE ARTHROPLASTY (Left)   Consults: Hospitalists  HPI: Gabriel Mcdonald is a 87 y.o. year old male with end stage OA of his left knee with progressively worsening pain and dysfunction. He has constant pain, with activity and at rest and significant functional deficits with difficulties even with ADLs. He has had extensive non-op management including analgesics, injections of cortisone and viscosupplements, and home exercise program, but remains in significant pain with significant dysfunction. Radiographs show bone on bone arthritis patellofemoral. He presents now for left Total Knee Arthroplasty.    Laboratory Data: Admission on 08/27/2020, Discharged on 08/29/2020  Component Date Value Ref Range Status  . ABO/RH(D) 08/27/2020 O POS   Final  . Antibody Screen 08/27/2020 POS   Final  . Sample Expiration 08/27/2020 08/30/2020,2359   Final  . Unit Number 08/27/2020 [id number]   Final  . Blood Component Type 08/27/2020 RED CELLS,LR   Final  . Unit division 08/27/2020 00   Final  . Status of Unit 08/27/2020 REL FROM [facility]    Final  . Transfusion Status 08/27/2020 OK TO TRANSFUSE   Final  . Crossmatch Result 08/27/2020 COMPATIBLE   Final  . Donor AG Type 08/27/2020 NEGATIVE FOR DUFFY A ANTIGEN   Final  . Unit Number 08/27/2020 [id number]   Final  . Blood Component Type 08/27/2020 RED CELLS,LR   Final  . Unit division 08/27/2020 00   Final  . Status of Unit 08/27/2020 REL FROM [facility]   Final  . Transfusion Status 08/27/2020 OK TO TRANSFUSE   Final  . Crossmatch Result 08/27/2020 COMPATIBLE   Final  . Donor AG Type 08/27/2020 NEGATIVE FOR DUFFY A ANTIGEN   Final  . Blood Product Unit Number 08/27/2020 [id number]   Final  . PRODUCT CODE 08/27/2020 [id number]   Final  . Unit Type and Rh 08/27/2020 5100   Final  . Blood Product Expiration Date 08/27/2020 [id number]   Final  . Blood Product Unit Number 08/27/2020 [id number]   Final  . PRODUCT CODE 08/27/2020 [id number]   Final  . Unit Type and Rh 08/27/2020 5100   Final  . Blood Product Expiration Date 08/27/2020 [id number]   Final  . Sodium 08/28/2020 136  135 - 145 mmol/L Final  . Potassium 08/28/2020 4.1  3.5 - 5.1 mmol/L Final  . Chloride 08/28/2020 105  98 - 111 mmol/L Final  . CO2 08/28/2020 23  22 - 32 mmol/L Final  . Glucose, Bld 08/28/2020 140* 70 - 99 mg/dL Final   Glucose reference range applies only to samples taken after fasting for  at least 8 hours.  . BUN 08/28/2020 18  8 - 23 mg/dL Final  . Creatinine, Ser 08/28/2020 1.22  0.61 - 1.24 mg/dL Final  . Calcium 08/28/2020 9.3  8.9 - 10.3 mg/dL Final  . Total Protein 08/28/2020 6.1* 6.5 - 8.1 g/dL Final  . Albumin 08/28/2020 3.5  3.5 - 5.0 g/dL Final  . AST 08/28/2020 21  15 - 41 U/L Final  . ALT 08/28/2020 13  0 - 44 U/L Final  . Alkaline Phosphatase 08/28/2020 54  38 - 126 U/L Final  . Total Bilirubin 08/28/2020 0.7  0.3 - 1.2 mg/dL Final  . GFR, Estimated 08/28/2020 58* >60 mL/min Final   Comment: (NOTE) Calculated using the CKD-EPI Creatinine Equation (2021)   . Anion gap 08/28/2020  8  5 - 15 Final   Performed at [facility], [place] [address], [place], [place] [address]  . WBC 08/28/2020 12.2* 4.0 - 10.5 K/uL Final  . RBC 08/28/2020 3.49* 4.22 - 5.81 MIL/uL Final  . Hemoglobin 08/28/2020 11.1* 13.0 - 17.0 g/dL Final  . HCT 08/28/2020 32.1* 39.0 - 52.0 % Final  . MCV 08/28/2020 92.0  80.0 - 100.0 fL Final  . MCH 08/28/2020 31.8  26.0 - 34.0 pg Final  . MCHC 08/28/2020 34.6  30.0 - 36.0 g/dL Final  . RDW 08/28/2020 14.1  11.5 - 15.5 % Final  . Platelets 08/28/2020 206  150 - 400 K/uL Final  . nRBC 08/28/2020 0.0  0.0 - 0.2 % Final   Performed at [facility], [place] [address] [place], [place] [address]  . Magnesium 08/28/2020 1.7  1.7 - 2.4 mg/dL Final   Performed at [place] [address] [place], [place] [address]  . Phosphorus 08/28/2020 2.3* 2.5 - 4.6 mg/dL Final   Performed at [place] [address], [place], [place] [address]  . WBC 08/29/2020 10.9* 4.0 - 10.5 K/uL Final  . RBC 08/29/2020 3.45* 4.22 - 5.81 MIL/uL Final  . Hemoglobin 08/29/2020 10.9* 13.0 - 17.0 g/dL Final  . HCT 08/29/2020 31.6* 39.0 - 52.0 % Final  . MCV 08/29/2020 91.6  80.0 - 100.0 fL Final  . MCH 08/29/2020 31.6  26.0 - 34.0 pg Final  . MCHC 08/29/2020 34.5  30.0 - 36.0 g/dL Final  . RDW 08/29/2020 14.3  11.5 - 15.5 % Final  . Platelets 08/29/2020 193  150 - 400 K/uL Final  . nRBC 08/29/2020 0.0  0.0 - 0.2 % Final   Performed at [facility], [place] [address], [place], [place] [address]  . Sodium 08/29/2020 134* 135 - 145 mmol/L Final  . Potassium 08/29/2020 3.9  3.5 - 5.1 mmol/L Final  . Chloride 08/29/2020 102  98 - 111 mmol/L Final  . CO2 08/29/2020 23  22 - 32 mmol/L Final  . Glucose, Bld 08/29/2020 117* 70 - 99 mg/dL Final   Glucose reference range applies only to samples taken after fasting for at least 8 hours.  . BUN 08/29/2020 16  8 - 23 mg/dL Final  . Creatinine, Ser  08/29/2020 0.97  0.61 - 1.24 mg/dL Final  . Calcium 08/29/2020 9.0  8.9 - 10.3 mg/dL Final  . Phosphorus 08/29/2020 2.4* 2.5 - 4.6 mg/dL Final  . Albumin 08/29/2020 3.6  3.5 - 5.0 g/dL Final  . GFR, Estimated 08/29/2020 >60  >60 mL/min Final   Comment: (NOTE) Calculated using the CKD-EPI Creatinine Equation (2021)   . Anion gap  08/29/2020 9  5 - 15 Final   Performed at [facility], [place] [address], [place], [place] [address]  . Magnesium 08/29/2020 1.7  1.7 - 2.4 mg/dL Final   Performed at [place] [address] [place], [place] [address]  Hospital Outpatient Visit on 08/24/2020  Component Date Value Ref Range Status  . SARS Coronavirus 2 08/24/2020 NEGATIVE  NEGATIVE Final   Comment: (NOTE) SARS-CoV-2 target nucleic acids are NOT DETECTED.  The SARS-CoV-2 RNA is generally detectable in upper and lower respiratory specimens during the acute phase of infection. Negative results do not preclude SARS-CoV-2 infection, do not rule out co-infections with other pathogens, and should not be used as the sole basis for treatment or other patient management decisions. Negative results must be combined with clinical observations, patient history, and epidemiological information. The expected result is Negative.  Fact Sheet for Patients: SugarRoll.be  Fact Sheet for Healthcare Providers: https://www.woods-mathews.com/  This test is not yet approved or cleared by the [place] FDA and  has been authorized for detection and/or diagnosis of SARS-CoV-2 by FDA under an Emergency Use Authorization (EUA). This EUA will remain  in effect (meaning this test can be used) for the duration of the COVID-19 declaration under Se                          ction 564(b)(1) of the Act, 21 U.S.C. section 360bbb-3(b)(1), unless the authorization is terminated or revoked sooner.  Performed at [facility], [place] [address], [place], [place] [address]   Hospital Outpatient Visit on 08/21/2020  Component Date Value Ref Range Status  . MRSA, PCR 08/21/2020 NEGATIVE  NEGATIVE Final  . Staphylococcus aureus 08/21/2020 POSITIVE* NEGATIVE Final   Comment: (NOTE) The Xpert SA Assay (FDA approved for NASAL specimens in patients 39 years of age and older), is one component of a comprehensive surveillance program. It is not intended to diagnose infection nor to guide or monitor treatment. Performed at [facility], [place] [address] [place], [place] [address]   . WBC 08/21/2020 5.7  4.0 - 10.5 K/uL Final  . RBC 08/21/2020 4.01* 4.22 - 5.81 MIL/uL Final  . Hemoglobin 08/21/2020 12.6* 13.0 - 17.0 g/dL Final  . HCT 08/21/2020 36.6* 39.0 - 52.0 % Final  . MCV 08/21/2020 91.3  80.0 - 100.0 fL Final  . MCH 08/21/2020 31.4  26.0 - 34.0 pg Final  . MCHC 08/21/2020 34.4  30.0 - 36.0 g/dL Final  . RDW 08/21/2020 13.9  11.5 - 15.5 % Final  . Platelets 08/21/2020 239  150 - 400 K/uL Final  . nRBC 08/21/2020 0.0  0.0 - 0.2 % Final   Performed at [facility], [place] [address], [place],  [address]  . Sodium 08/21/2020 138  135 - 145 mmol/L Final  . Potassium 08/21/2020 4.0  3.5 - 5.1 mmol/L Final  . Chloride 08/21/2020 106  98 - 111 mmol/L Final  . CO2 08/21/2020 26  22 - 32 mmol/L Final  . Glucose, Bld 08/21/2020 115* 70 - 99 mg/dL Final   Glucose reference range applies only to samples taken after fasting for at least 8 hours.  . BUN 08/21/2020 19  8 - 23 mg/dL Final  . Creatinine, Ser 08/21/2020 1.32* 0.61 - 1.24 mg/dL Final  . Calcium 08/21/2020 9.6  8.9 - 10.3 mg/dL Final  . Total Protein 08/21/2020 6.5  6.5 - 8.1 g/dL  Final  . Albumin 08/21/2020 3.9  3.5 - 5.0 g/dL Final  . AST 08/21/2020 25  15 - 41 U/L Final  . ALT 08/21/2020 19  0 - 44 U/L Final  . Alkaline Phosphatase 08/21/2020 74  38 - 126 U/L Final  . Total Bilirubin 08/21/2020 0.8  0.3 - 1.2 mg/dL Final  .  GFR, Estimated 08/21/2020 53* >60 mL/min Final   Comment: (NOTE) Calculated using the CKD-EPI Creatinine Equation (2021)   . Anion gap 08/21/2020 6  5 - 15 Final   Performed at [facility], [place] [address] [place], [place] [address]  . Prothrombin Time 08/21/2020 12.5  11.4 - 15.2 seconds Final  . INR 08/21/2020 1.0  0.8 - 1.2 Final   Comment: (NOTE) INR goal varies based on device and disease states. Performed at [facility], [place] [address] [place], [place] [address]   . aPTT 08/21/2020 31  24 - 36 seconds Final   Performed at [facility], [place] [address] [place], [place] [address]  . ABO/RH(D) 08/21/2020 O POS   Final  . Antibody Screen 08/21/2020 POS   Final  . Sample Expiration 08/21/2020 08/26/2020,2359   Final  . Antibody Identification 09/47/0962    Final                   Value:ANTI FYA ([place] a) Performed at [facility], [place] [address], [place], [place] [address]   . Unit Number 08/21/2020 [id number]   Final  . Blood Component Type 08/21/2020 RED CELLS,LR   Final  . Unit division 08/21/2020 00   Final  . Status of Unit 08/21/2020 REL FROM [facility]   Final  . Transfusion Status 08/21/2020 OK TO TRANSFUSE   Final  . Crossmatch Result 08/21/2020 COMPATIBLE   Final  . Unit Number 08/21/2020 [id number]   Final  . Blood Component Type 08/21/2020 RED CELLS,LR   Final  . Unit division 08/21/2020 00   Final  . Status of Unit 08/21/2020 REL FROM [facility]   Final  . Transfusion Status 08/21/2020 OK TO TRANSFUSE   Final  . Crossmatch Result 08/21/2020 COMPATIBLE   Final  . Blood Product Unit Number 08/21/2020 [id number]   Final  . PRODUCT CODE 08/21/2020 [id number]   Final  . Unit Type and Rh 08/21/2020 5100   Final  . Blood Product Expiration Date 08/21/2020 [id number]   Final  . Blood Product Unit Number 08/21/2020 [id number]   Final  . PRODUCT CODE 08/21/2020 [id number]   Final  . Unit Type  and Rh 08/21/2020 5100   Final  . Blood Product Expiration Date 08/21/2020 [id number]   Final     X-Rays:No results found.  EKG: Orders placed or performed during the hospital encounter of 08/27/20  . EKG 12-Lead  . EKG 12-Lead  . EKG 12-Lead  . EKG 12-Lead     Hospital Course: Gabriel Mcdonald is a 87 y.o. who was admitted to [facility]. They were brought to the operating room on 08/27/2020 and underwent Procedure(s): TOTAL KNEE ARTHROPLASTY.  Patient tolerated the procedure well and was later transferred to the recovery room and then to the orthopaedic floor for postoperative care. They were given PO and IV analgesics for pain control following their surgery. They were given 24 hours of postoperative antibiotics of  Anti-infectives (From admission, onward)   Start     Dose/Rate Route Frequency Ordered Stop   08/27/20 1330  ceFAZolin (ANCEF) IVPB  2g/100 mL premix        2 g 200 mL/hr over 30 Minutes Intravenous Every 6 hours 08/27/20 1055 08/27/20 2035   08/27/20 0615  ceFAZolin (ANCEF) IVPB 2g/100 mL premix        2 g 200 mL/hr over 30 Minutes Intravenous On call to O.R. 08/27/20 0603 08/27/20 0800     and started on DVT prophylaxis in the form of Xarelto.   PT and OT were ordered for total joint protocol. Discharge planning consulted to help with postop disposition and equipment needs. Patient had issues with delirium last night, was violent with the nursing staff and ripped out IVs. Required security to calm down. This AM during rounds, patient was noncombative with Dr. Wynelle Link, but was not oriented to time or place. For both the patient's safety and the safety of others, order placed for soft waist restraint. All narcotic medications were discontinued and hospitalist service was consulted for assistance with management of delirium. Patient did not work with therapy on day one due to confusion. On the morning of day 2, patient was feeling better with much improved cognition. He  worked with therapy for two sessions and was meeting his goals. Mental orientation was at baseline, and patient and wife both requested that he be discharged so he could be at home. Dr. Wynelle Link saw Mr. Mcdonald one final time to ensure he was appropriate for discharge and had no concerns. He was discharged that afternoon in stable condition.   Diet: Cardiac diet Activity: WBAT Follow-up: in 2 weeks Disposition: Home with OPPT Discharged Condition: stable   Discharge Instructions    Call MD / Call 911   Complete by: As directed    If you experience chest pain or shortness of breath, CALL 911 and be transported to the hospital emergency room.  If you develope a fever above 101 F, pus (white drainage) or increased drainage or redness at the wound, or calf pain, call your surgeon's office.   Change dressing   Complete by: As directed    You may remove the bulky bandage (ACE wrap and gauze) two days after surgery. You will have an adhesive waterproof bandage underneath. Leave this in place until your first follow-up appointment.   Constipation Prevention   Complete by: As directed    Drink plenty of fluids.  Prune juice may be helpful.  You may use a stool softener, such as Colace (over the counter) 100 mg twice a day.  Use MiraLax (over the counter) for constipation as needed.   Diet - low sodium heart healthy   Complete by: As directed    Do not put a pillow under the knee. Place it under the heel.   Complete by: As directed    Driving restrictions   Complete by: As directed    No driving for two weeks   TED hose   Complete by: As directed    Use stockings (TED hose) for three weeks on both leg(s).  You may remove them at night for sleeping.   Weight bearing as tolerated   Complete by: As directed      Allergies as of 08/29/2020   No Active Allergies     Medication List    STOP taking these medications   aspirin 81 MG tablet   diclofenac Sodium 1 % Gel Commonly known as: VOLTAREN    Fish Oil 1200 MG Caps   tiZANidine 4 MG tablet Commonly known as: ZANAFLEX   vitamin B-12 1000  MCG tablet Commonly known as: CYANOCOBALAMIN   Vitamin D-3 125 MCG (5000 UT) Tabs     TAKE these medications   acetaminophen 500 MG tablet Commonly known as: TYLENOL Take 1-2 tablets (500-1,000 mg total) by mouth every 8 (eight) hours as needed for mild pain or moderate pain.   albuterol 108 (90 Base) MCG/ACT inhaler Commonly known as: VENTOLIN HFA Inhale 2 puffs into the lungs every 6 (six) hours as needed for shortness of breath or wheezing.   amLODipine 5 MG tablet Commonly known as: NORVASC Take 5 mg by mouth daily.   atorvastatin 10 MG tablet Commonly known as: LIPITOR Take 10 mg by mouth every evening.   FeroSul 325 (65 FE) MG tablet Generic drug: ferrous sulfate Take 325 mg by mouth 2 (two) times daily.   folic acid 1 MG tablet Commonly known as: FOLVITE Take 1 mg by mouth daily.   lisinopril 20 MG tablet Commonly known as: ZESTRIL Take 20 mg by mouth daily.   LORazepam 1 MG tablet Commonly known as: ATIVAN Take 0.5 mg by mouth daily as needed for anxiety.   methocarbamol 500 MG tablet Commonly known as: ROBAXIN Take 1 tablet (500 mg total) by mouth every 6 (six) hours as needed for muscle spasms.   metoprolol succinate 50 MG 24 hr tablet Commonly known as: TOPROL-XL Take 50 mg by mouth every other day. Take with or immediately following a meal.   omeprazole 20 MG capsule Commonly known as: PRILOSEC Take 20 mg by mouth daily.   polyethylene glycol powder 17 GM/SCOOP powder Commonly known as: GLYCOLAX/MIRALAX Take 17 g by mouth daily as needed for moderate constipation.   Refresh Contacts Drops Soln Place 1 drop into both eyes daily as needed (dry eyes).   rivaroxaban 10 MG Tabs tablet Commonly known as: XARELTO Take 1 tablet (10 mg total) by mouth daily with breakfast for 19 days. Then resume one 81 mg aspirin once a day.   tamsulosin 0.4 MG Caps  capsule Commonly known as: FLOMAX Take 0.4 mg by mouth every evening.   theophylline 200 MG 12 hr tablet Commonly known as: THEODUR Take 400 mg by mouth every morning.   traMADol 50 MG tablet Commonly known as: ULTRAM Take 50 mg by mouth every 6 (six) hours as needed for moderate pain.            Discharge Care Instructions  (From admission, onward)         Start     Ordered   08/29/20 0000  Weight bearing as tolerated        08/29/20 1732   08/29/20 0000  Change dressing       Comments: You may remove the bulky bandage (ACE wrap and gauze) two days after surgery. You will have an adhesive waterproof bandage underneath. Leave this in place until your first follow-up appointment.   08/29/20 1732          Follow-up Information    Gaynelle Arabian, MD. Go on 09/11/2020.   Specialty: Orthopedic Surgery Why: You are scheduled for first post op appointment on Tuesday April 19th at 3:45pm. Contact information: [address] [place] [address] [phone]               Signed: Theresa Duty, PA-C Orthopedic Surgery 09/05/2020, 2:00 PM

## 2020-09-07 DIAGNOSIS — Z471 Aftercare following joint replacement surgery: Secondary | ICD-10-CM | POA: Diagnosis not present

## 2020-09-07 DIAGNOSIS — M25561 Pain in right knee: Secondary | ICD-10-CM | POA: Diagnosis not present

## 2020-09-07 DIAGNOSIS — M6281 Muscle weakness (generalized): Secondary | ICD-10-CM | POA: Diagnosis not present

## 2020-09-10 DIAGNOSIS — M6281 Muscle weakness (generalized): Secondary | ICD-10-CM | POA: Diagnosis not present

## 2020-09-10 DIAGNOSIS — M25562 Pain in left knee: Secondary | ICD-10-CM | POA: Diagnosis not present

## 2020-09-10 DIAGNOSIS — Z471 Aftercare following joint replacement surgery: Secondary | ICD-10-CM | POA: Diagnosis not present

## 2020-09-10 DIAGNOSIS — M25561 Pain in right knee: Secondary | ICD-10-CM | POA: Diagnosis not present

## 2020-09-17 DIAGNOSIS — M6281 Muscle weakness (generalized): Secondary | ICD-10-CM | POA: Diagnosis not present

## 2020-09-17 DIAGNOSIS — M25562 Pain in left knee: Secondary | ICD-10-CM | POA: Diagnosis not present

## 2020-09-17 DIAGNOSIS — M25561 Pain in right knee: Secondary | ICD-10-CM | POA: Diagnosis not present

## 2020-09-17 DIAGNOSIS — Z471 Aftercare following joint replacement surgery: Secondary | ICD-10-CM | POA: Diagnosis not present

## 2020-09-20 DIAGNOSIS — M25561 Pain in right knee: Secondary | ICD-10-CM | POA: Diagnosis not present

## 2020-09-20 DIAGNOSIS — Z471 Aftercare following joint replacement surgery: Secondary | ICD-10-CM | POA: Diagnosis not present

## 2020-09-20 DIAGNOSIS — M25562 Pain in left knee: Secondary | ICD-10-CM | POA: Diagnosis not present

## 2020-09-20 DIAGNOSIS — M6281 Muscle weakness (generalized): Secondary | ICD-10-CM | POA: Diagnosis not present

## 2020-09-21 DIAGNOSIS — R110 Nausea: Secondary | ICD-10-CM | POA: Diagnosis not present

## 2020-09-21 DIAGNOSIS — I2510 Atherosclerotic heart disease of native coronary artery without angina pectoris: Secondary | ICD-10-CM | POA: Diagnosis not present

## 2020-09-21 DIAGNOSIS — F419 Anxiety disorder, unspecified: Secondary | ICD-10-CM | POA: Diagnosis not present

## 2020-09-21 DIAGNOSIS — K219 Gastro-esophageal reflux disease without esophagitis: Secondary | ICD-10-CM | POA: Diagnosis not present

## 2020-09-21 DIAGNOSIS — I6789 Other cerebrovascular disease: Secondary | ICD-10-CM | POA: Diagnosis not present

## 2020-09-21 DIAGNOSIS — R9431 Abnormal electrocardiogram [ECG] [EKG]: Secondary | ICD-10-CM | POA: Diagnosis not present

## 2020-09-21 DIAGNOSIS — J45909 Unspecified asthma, uncomplicated: Secondary | ICD-10-CM | POA: Diagnosis not present

## 2020-09-21 DIAGNOSIS — I440 Atrioventricular block, first degree: Secondary | ICD-10-CM | POA: Diagnosis not present

## 2020-09-21 DIAGNOSIS — I6782 Cerebral ischemia: Secondary | ICD-10-CM | POA: Diagnosis not present

## 2020-09-21 DIAGNOSIS — R001 Bradycardia, unspecified: Secondary | ICD-10-CM | POA: Diagnosis not present

## 2020-09-21 DIAGNOSIS — R531 Weakness: Secondary | ICD-10-CM | POA: Diagnosis not present

## 2020-09-21 DIAGNOSIS — Z87891 Personal history of nicotine dependence: Secondary | ICD-10-CM | POA: Diagnosis not present

## 2020-09-21 DIAGNOSIS — R5383 Other fatigue: Secondary | ICD-10-CM | POA: Diagnosis not present

## 2020-09-21 DIAGNOSIS — Z951 Presence of aortocoronary bypass graft: Secondary | ICD-10-CM | POA: Diagnosis not present

## 2020-09-21 DIAGNOSIS — I10 Essential (primary) hypertension: Secondary | ICD-10-CM | POA: Diagnosis not present

## 2020-09-21 DIAGNOSIS — Z79899 Other long term (current) drug therapy: Secondary | ICD-10-CM | POA: Diagnosis not present

## 2020-09-24 DIAGNOSIS — Z471 Aftercare following joint replacement surgery: Secondary | ICD-10-CM | POA: Diagnosis not present

## 2020-09-24 DIAGNOSIS — M6281 Muscle weakness (generalized): Secondary | ICD-10-CM | POA: Diagnosis not present

## 2020-09-24 DIAGNOSIS — M25562 Pain in left knee: Secondary | ICD-10-CM | POA: Diagnosis not present

## 2020-09-24 DIAGNOSIS — M25561 Pain in right knee: Secondary | ICD-10-CM | POA: Diagnosis not present

## 2020-09-27 DIAGNOSIS — M25562 Pain in left knee: Secondary | ICD-10-CM | POA: Diagnosis not present

## 2020-09-27 DIAGNOSIS — M25561 Pain in right knee: Secondary | ICD-10-CM | POA: Diagnosis not present

## 2020-09-27 DIAGNOSIS — Z471 Aftercare following joint replacement surgery: Secondary | ICD-10-CM | POA: Diagnosis not present

## 2020-09-27 DIAGNOSIS — M6281 Muscle weakness (generalized): Secondary | ICD-10-CM | POA: Diagnosis not present

## 2020-10-02 DIAGNOSIS — M25561 Pain in right knee: Secondary | ICD-10-CM | POA: Diagnosis not present

## 2020-10-02 DIAGNOSIS — M25562 Pain in left knee: Secondary | ICD-10-CM | POA: Diagnosis not present

## 2020-10-02 DIAGNOSIS — Z471 Aftercare following joint replacement surgery: Secondary | ICD-10-CM | POA: Diagnosis not present

## 2020-10-02 DIAGNOSIS — M6281 Muscle weakness (generalized): Secondary | ICD-10-CM | POA: Diagnosis not present

## 2020-10-04 DIAGNOSIS — M25562 Pain in left knee: Secondary | ICD-10-CM | POA: Diagnosis not present

## 2020-10-04 DIAGNOSIS — M6281 Muscle weakness (generalized): Secondary | ICD-10-CM | POA: Diagnosis not present

## 2020-10-04 DIAGNOSIS — Z471 Aftercare following joint replacement surgery: Secondary | ICD-10-CM | POA: Diagnosis not present

## 2020-10-04 DIAGNOSIS — M25561 Pain in right knee: Secondary | ICD-10-CM | POA: Diagnosis not present

## 2020-10-09 DIAGNOSIS — Z471 Aftercare following joint replacement surgery: Secondary | ICD-10-CM | POA: Diagnosis not present

## 2020-10-09 DIAGNOSIS — Z96652 Presence of left artificial knee joint: Secondary | ICD-10-CM | POA: Diagnosis not present

## 2020-10-16 DIAGNOSIS — Z23 Encounter for immunization: Secondary | ICD-10-CM | POA: Diagnosis not present

## 2020-10-22 DIAGNOSIS — K219 Gastro-esophageal reflux disease without esophagitis: Secondary | ICD-10-CM | POA: Diagnosis not present

## 2020-10-22 DIAGNOSIS — I10 Essential (primary) hypertension: Secondary | ICD-10-CM | POA: Diagnosis not present

## 2020-10-24 DIAGNOSIS — L570 Actinic keratosis: Secondary | ICD-10-CM | POA: Diagnosis not present

## 2020-12-20 DIAGNOSIS — I10 Essential (primary) hypertension: Secondary | ICD-10-CM | POA: Diagnosis not present

## 2020-12-20 DIAGNOSIS — L570 Actinic keratosis: Secondary | ICD-10-CM | POA: Diagnosis not present

## 2020-12-21 DIAGNOSIS — D649 Anemia, unspecified: Secondary | ICD-10-CM | POA: Diagnosis not present

## 2020-12-31 DIAGNOSIS — M961 Postlaminectomy syndrome, not elsewhere classified: Secondary | ICD-10-CM | POA: Diagnosis not present

## 2020-12-31 DIAGNOSIS — M48062 Spinal stenosis, lumbar region with neurogenic claudication: Secondary | ICD-10-CM | POA: Diagnosis not present

## 2020-12-31 DIAGNOSIS — M47816 Spondylosis without myelopathy or radiculopathy, lumbar region: Secondary | ICD-10-CM | POA: Diagnosis not present

## 2021-01-16 ENCOUNTER — Encounter (HOSPITAL_COMMUNITY): Payer: Self-pay

## 2021-01-16 ENCOUNTER — Emergency Department (HOSPITAL_COMMUNITY): Payer: Medicare Other

## 2021-01-16 ENCOUNTER — Emergency Department (HOSPITAL_COMMUNITY)
Admission: EM | Admit: 2021-01-16 | Discharge: 2021-01-16 | Disposition: A | Discharge status: Home / Self Care | Payer: Medicare Other | Attending: Emergency Medicine | Admitting: Emergency Medicine

## 2021-01-16 DIAGNOSIS — Z79899 Other long term (current) drug therapy: Secondary | ICD-10-CM | POA: Insufficient documentation

## 2021-01-16 DIAGNOSIS — Z96652 Presence of left artificial knee joint: Secondary | ICD-10-CM | POA: Diagnosis not present

## 2021-01-16 DIAGNOSIS — I10 Essential (primary) hypertension: Secondary | ICD-10-CM | POA: Insufficient documentation

## 2021-01-16 DIAGNOSIS — Z7982 Long term (current) use of aspirin: Secondary | ICD-10-CM | POA: Diagnosis not present

## 2021-01-16 DIAGNOSIS — Z8546 Personal history of malignant neoplasm of prostate: Secondary | ICD-10-CM | POA: Insufficient documentation

## 2021-01-16 DIAGNOSIS — J449 Chronic obstructive pulmonary disease, unspecified: Secondary | ICD-10-CM | POA: Diagnosis not present

## 2021-01-16 DIAGNOSIS — I2510 Atherosclerotic heart disease of native coronary artery without angina pectoris: Secondary | ICD-10-CM | POA: Insufficient documentation

## 2021-01-16 DIAGNOSIS — Z951 Presence of aortocoronary bypass graft: Secondary | ICD-10-CM | POA: Diagnosis not present

## 2021-01-16 DIAGNOSIS — R001 Bradycardia, unspecified: Secondary | ICD-10-CM | POA: Insufficient documentation

## 2021-01-16 DIAGNOSIS — S0990XA Unspecified injury of head, initial encounter: Secondary | ICD-10-CM | POA: Diagnosis not present

## 2021-01-16 DIAGNOSIS — M25562 Pain in left knee: Secondary | ICD-10-CM | POA: Diagnosis not present

## 2021-01-16 DIAGNOSIS — Z7901 Long term (current) use of anticoagulants: Secondary | ICD-10-CM | POA: Diagnosis not present

## 2021-01-16 DIAGNOSIS — W01198A Fall on same level from slipping, tripping and stumbling with subsequent striking against other object, initial encounter: Secondary | ICD-10-CM | POA: Diagnosis not present

## 2021-01-16 DIAGNOSIS — Y9301 Activity, walking, marching and hiking: Secondary | ICD-10-CM | POA: Insufficient documentation

## 2021-01-16 DIAGNOSIS — M25512 Pain in left shoulder: Secondary | ICD-10-CM | POA: Diagnosis not present

## 2021-01-16 DIAGNOSIS — W19XXXA Unspecified fall, initial encounter: Secondary | ICD-10-CM

## 2021-01-16 DIAGNOSIS — S0081XA Abrasion of other part of head, initial encounter: Secondary | ICD-10-CM | POA: Diagnosis not present

## 2021-01-16 NOTE — Discharge Instructions (Addendum)
You were seen in the emergency department for evaluation of head injury after a fall.  You had a CAT scan of your head that did not show any acute traumatic findings.  We are giving you a list of head injury instructions.  Please rest and drink plenty of fluids.  Follow-up with your doctor.  Return to the emergency department if any worsening or concerning symptoms.

## 2021-01-16 NOTE — ED Triage Notes (Signed)
Pt arrived via POV, mechanical fall yesterday, fell on pavement, pain to entire left side of body. Was seen at [facility], had xrays done of knee and shoulder, told to come to ED for head CT. No LOC, no thinners.

## 2021-01-16 NOTE — ED Provider Notes (Signed)
[facility] DEPT Provider Note   CSN: [id number] Arrival date & time: 01/16/21  1030     History Chief Complaint  Patient presents with   Fall   Head Injury    Gabriel Mcdonald is a 87 y.o. male.  He had a mechanical fall yesterday when he tripped going over a curb.  He did hit his head and landed on his left shoulder and leg.  [place] traveled back home and went to [facility] today where he had x-rays of his shoulder and left knee.  They recommended he come to the emergency department for a CAT scan of his head.  He denies any loss of consciousness no neck pain.  No numbness or weakness.  No chest or abdominal pain.  The history is provided by the patient.  Fall This is a new problem. The current episode started yesterday. The problem has been gradually improving. Pertinent negatives include no chest pain, no abdominal pain, no headaches and no shortness of breath. Nothing aggravates the symptoms. Nothing relieves the symptoms. He has tried nothing for the symptoms.  Head Injury Location:  Frontal Time since incident:  1 day Mechanism of injury: fall   Fall:    Fall occurred:  Walking   Point of impact:  Head   Entrapped after fall: no   Pain details:    Quality:  Dull   Severity:  Mild   Progression:  Improving Relieved by:  Rest Worsened by:  Nothing Ineffective treatments:  None tried Associated symptoms: no blurred vision, no difficulty breathing, no focal weakness, no headaches, no loss of consciousness, no nausea, no neck pain, no numbness and no vomiting       Past Medical History:  Diagnosis Date   Cancer ([place])    prostate   Cataract    COPD (chronic obstructive pulmonary disease) ([facility])    Pleural thickening per pt, pt reports exposure to asbestos   Coronary artery disease    GERD (gastroesophageal reflux disease)    mild   High cholesterol    Hypertension    Shortness of breath     Patient Active Problem  List   Diagnosis Date Noted   Acute confusional state 08/29/2020   Primary osteoarthritis of left knee 08/27/2020   OA (osteoarthritis) of knee 05/14/2020   Renal calculus, left 05/29/2015   Lumbar herniated disc 05/03/2013    Past Surgical History:  Procedure Laterality Date   CATARACT EXTRACTION W/PHACO  04/29/2012   Procedure: CATARACT EXTRACTION PHACO AND INTRAOCULAR LENS PLACEMENT ([place]);  Surgeon: Tonny Branch, MD;  Location: [facility];  Service: Ophthalmology;  Laterality: Right;  CDE:10.09   CATARACT EXTRACTION W/PHACO  06/07/2012   Procedure: CATARACT EXTRACTION PHACO AND INTRAOCULAR LENS PLACEMENT ([facility]);  Surgeon: Tonny Branch, MD;  Location: [facility];  Service: Ophthalmology;  Laterality: Left;  CDE:16.19   CORONARY ANGIOPLASTY  2011   stents   CORONARY ARTERY BYPASS GRAFT  [facility]   EYE SURGERY     LUMBAR LAMINECTOMY/DECOMPRESSION MICRODISCECTOMY Right 05/03/2013   Procedure: RIGHT LUMBAR DISCECTOMY LUMBAR FOUR-FIVE;  Surgeon: Floyce Stakes, MD;  Location: [facility];  Service: Neurosurgery;  Laterality: Right;  right    Pylonodial cyst removal     from tip of spine   TONSILLECTOMY     TOTAL KNEE ARTHROPLASTY Left 08/27/2020   Procedure: TOTAL KNEE ARTHROPLASTY;  Surgeon: Gaynelle Arabian, MD;  Location: [facility];  Service: Orthopedics;  Laterality: Left;  10mn       Family History  Problem Relation Age of Onset   Colon cancer Neg Hx    Stomach cancer Neg Hx    Pancreatic cancer Neg Hx    Esophageal cancer Neg Hx    Liver disease Neg Hx     Social History   Tobacco Use   Smoking status: Never   Smokeless tobacco: Never  Vaping Use   Vaping Use: Never used  Substance Use Topics   Alcohol use: No   Drug use: No    Home Medications Prior to Admission medications   Medication Sig Start Date End Date Taking? Authorizing Provider  acetaminophen (TYLENOL) 500 MG tablet Take 1-2 tablets (500-1,000 mg total) by mouth every 8 (eight) hours as needed for  mild pain or moderate pain. 08/29/20   Edmisten, Kristie L, PA  albuterol (PROVENTIL HFA;VENTOLIN HFA) 108 (90 BASE) MCG/ACT inhaler Inhale 2 puffs into the lungs every 6 (six) hours as needed for shortness of breath or wheezing.    [provider]  amLODipine (NORVASC) 5 MG tablet Take 5 mg by mouth daily.     [provider]  atorvastatin (LIPITOR) 10 MG tablet Take 10 mg by mouth every evening.     [provider]  FEROSUL 325 (65 Fe) MG tablet Take 325 mg by mouth 2 (two) times daily. 05/16/20   [provider]  folic acid (FOLVITE) 1 MG tablet Take 1 mg by mouth daily.    [provider]  lisinopril (ZESTRIL) 20 MG tablet Take 20 mg by mouth daily.     [provider]  LORazepam (ATIVAN) 1 MG tablet Take 0.5 mg by mouth daily as needed for anxiety.     [provider]  methocarbamol (ROBAXIN) 500 MG tablet Take 1 tablet (500 mg total) by mouth every 6 (six) hours as needed for muscle spasms. 08/29/20   Edmisten, Kristie L, PA  metoprolol succinate (TOPROL-XL) 50 MG 24 hr tablet Take 50 mg by mouth every other day. Take with or immediately following a meal.    [provider]  omeprazole (PRILOSEC) 20 MG capsule Take 20 mg by mouth daily.    [provider]  polyethylene glycol powder (GLYCOLAX/MIRALAX) 17 GM/SCOOP powder Take 17 g by mouth daily as needed for moderate constipation.    [provider]  rivaroxaban (XARELTO) 10 MG TABS tablet Take 1 tablet (10 mg total) by mouth daily with breakfast for 19 days. Then resume one 81 mg aspirin once a day. 08/30/20 09/18/20  Edmisten, KOk Anis PA  Soft Lens Products (REFRESH CONTACTS DROPS) SOLN Place 1 drop into both eyes daily as needed (dry eyes).     [provider]  tamsulosin (FLOMAX) 0.4 MG CAPS capsule Take 0.4 mg by mouth every evening.  01/19/17   [provider]  theophylline (THEODUR) 200 MG 12 hr tablet Take 400 mg by mouth every morning.      [provider]  traMADol (ULTRAM) 50 MG tablet Take 50 mg by mouth every 6 (six) hours as needed for moderate pain.  12/12/16   [provider]    Allergies    Patient has no known allergies.  Review of Systems   Review of Systems  Constitutional:  Negative for fever.  HENT:  Negative for sore throat.   Eyes:  Negative for blurred vision and visual disturbance.  Respiratory:  Negative for shortness of breath.   Cardiovascular:  Negative  for chest pain.  Gastrointestinal:  Negative for abdominal pain, nausea and vomiting.  Genitourinary:  Negative for dysuria.  Musculoskeletal:  Negative for neck pain.  Skin:  Negative for rash.  Neurological:  Negative for focal weakness, loss of consciousness, numbness and headaches.   Physical Exam Updated Vital Signs BP (!) 170/63 (BP Location: Right Arm)   Pulse (!) 43   Temp 97.9 F (36.6 C) (Oral)   Resp 14   SpO2 100%   Physical Exam Vitals and nursing note reviewed.  Constitutional:      Appearance: Normal appearance. He is well-developed.  HENT:     Head: Normocephalic.     Comments: Small abrasion at left temple Eyes:     Conjunctiva/sclera: Conjunctivae normal.  Cardiovascular:     Rate and Rhythm: Regular rhythm. Bradycardia present.     Heart sounds: No murmur heard. Pulmonary:     Effort: Pulmonary effort is normal. No respiratory distress.     Breath sounds: Normal breath sounds.  Abdominal:     Palpations: Abdomen is soft.     Tenderness: There is no abdominal tenderness.  Musculoskeletal:        General: Tenderness present. No deformity. Normal range of motion.     Cervical back: Neck supple. No tenderness.     Comments: He does have some tenderness about his left shoulder and his left knee although reportedly has already had negative x-rays done earlier today.  Skin:    General: Skin is warm and dry.  Neurological:     General: No focal deficit present.     Mental Status: He is alert.  Mental status is at baseline.    ED Results / Procedures / Treatments   Labs (all labs ordered are listed, but only abnormal results are displayed) Labs Reviewed - No data to display  EKG None  Radiology CT HEAD WO CONTRAST (5MM)  Result Date: 01/16/2021 CLINICAL DATA:  Head trauma.  Moderate to severe. EXAM: CT HEAD WITHOUT CONTRAST TECHNIQUE: Contiguous axial images were obtained from the base of the skull through the vertex without intravenous contrast. COMPARISON:  CT head, 09/21/2020. FINDINGS: Brain: No evidence of acute infarction, hemorrhage, hydrocephalus, extra-axial collection or mass lesion/mass effect. Vascular: No hyperdense vessel or unexpected calcification. Skull: Normal. Negative for fracture or focal lesion. Sinuses/Orbits: No acute finding. Other: mild cerebral volume volume loss, expected in patient this age. IMPRESSION: No acute intracranial abnormality. Electronically Signed   By: Michaelle Birks M.D.   On: 01/16/2021 12:55    Procedures Procedures   Medications Ordered in ED Medications - No data to display  ED Course  I have reviewed the triage vital signs and the nursing notes.  Pertinent labs & imaging results that were available during my care of the patient were reviewed by me and considered in my medical decision making (see chart for details).    MDM Rules/Calculators/A&P                          87 year old male on aspirin here after mechanical fall yesterday striking his head.  He had a head CT that did not show any acute findings.  This was done to rule out possible skull fracture or intracranial bleed.  Reviewed with patient and his wife.  They are comfortable plan for discharge.  Recommended follow-up with PCP and return instructions discussed.  Final Clinical Impression(s) / ED Diagnoses Final diagnoses:  Fall, initial encounter  Injury of head,  initial encounter    Rx / DC Orders ED Discharge Orders     None        Hayden Rasmussen,  MD 01/16/21 1744

## 2021-01-16 NOTE — ED Provider Notes (Signed)
Emergency Medicine Provider Triage Evaluation Note  Gabriel Mcdonald , a 87 y.o. male  was evaluated in triage.  Pt complains of a fall that occurred yesterday.  Patient was walking between 2 vehicles in a parking lot and tripped on a curb and fell to the pavement.  He fell and hit the entire left side of his body.  He went to [facility] earlier today and had x-rays of his left knee and shoulder and was told to come to the emergency department for an additional CT of the head.  Patient denies any LOC.  He is on 81 mg of ASA but otherwise is not anticoagulated.  Physical Exam  BP (!) 174/78 (BP Location: Left Arm)   Pulse 60   Temp 97.9 F (36.6 C) (Oral)   Resp 18   SpO2 100%  Gen:   Awake, no distress   Resp:  Normal effort   Medical Decision Making  Medically screening exam initiated at 11:24 AM.  Appropriate orders placed.  Gabriel Mcdonald was informed that the remainder of the evaluation will be completed by another provider, this initial triage assessment does not replace that evaluation, and the importance of remaining in the ED until their evaluation is complete.   Rayna Sexton, PA-C 01/16/21 1125    Hayden Rasmussen, MD 01/16/21 1755

## 2021-01-23 DIAGNOSIS — M47816 Spondylosis without myelopathy or radiculopathy, lumbar region: Secondary | ICD-10-CM | POA: Diagnosis not present

## 2021-01-31 DIAGNOSIS — M25512 Pain in left shoulder: Secondary | ICD-10-CM | POA: Diagnosis not present

## 2021-02-01 DIAGNOSIS — M25561 Pain in right knee: Secondary | ICD-10-CM | POA: Diagnosis not present

## 2021-02-01 DIAGNOSIS — Z96652 Presence of left artificial knee joint: Secondary | ICD-10-CM | POA: Diagnosis not present

## 2021-02-08 DIAGNOSIS — M75121 Complete rotator cuff tear or rupture of right shoulder, not specified as traumatic: Secondary | ICD-10-CM | POA: Diagnosis not present

## 2021-02-08 DIAGNOSIS — S43432D Superior glenoid labrum lesion of left shoulder, subsequent encounter: Secondary | ICD-10-CM | POA: Diagnosis not present

## 2021-02-27 DIAGNOSIS — I10 Essential (primary) hypertension: Secondary | ICD-10-CM | POA: Diagnosis not present

## 2021-03-05 DIAGNOSIS — Y999 Unspecified external cause status: Secondary | ICD-10-CM | POA: Diagnosis not present

## 2021-03-05 DIAGNOSIS — S43492A Other sprain of left shoulder joint, initial encounter: Secondary | ICD-10-CM | POA: Diagnosis not present

## 2021-03-05 DIAGNOSIS — S43432A Superior glenoid labrum lesion of left shoulder, initial encounter: Secondary | ICD-10-CM | POA: Diagnosis not present

## 2021-03-05 DIAGNOSIS — X58XXXA Exposure to other specified factors, initial encounter: Secondary | ICD-10-CM | POA: Diagnosis not present

## 2021-03-05 DIAGNOSIS — M7542 Impingement syndrome of left shoulder: Secondary | ICD-10-CM | POA: Diagnosis not present

## 2021-03-05 DIAGNOSIS — M19012 Primary osteoarthritis, left shoulder: Secondary | ICD-10-CM | POA: Diagnosis not present

## 2021-03-05 DIAGNOSIS — S46012A Strain of muscle(s) and tendon(s) of the rotator cuff of left shoulder, initial encounter: Secondary | ICD-10-CM | POA: Diagnosis not present

## 2021-03-05 DIAGNOSIS — S46212A Strain of muscle, fascia and tendon of other parts of biceps, left arm, initial encounter: Secondary | ICD-10-CM | POA: Diagnosis not present

## 2021-03-05 DIAGNOSIS — S46192A Other injury of muscle, fascia and tendon of long head of biceps, left arm, initial encounter: Secondary | ICD-10-CM | POA: Diagnosis not present

## 2021-03-12 DIAGNOSIS — M25512 Pain in left shoulder: Secondary | ICD-10-CM | POA: Diagnosis not present

## 2021-03-12 DIAGNOSIS — Z4789 Encounter for other orthopedic aftercare: Secondary | ICD-10-CM | POA: Diagnosis not present

## 2021-03-12 DIAGNOSIS — M6281 Muscle weakness (generalized): Secondary | ICD-10-CM | POA: Diagnosis not present

## 2021-03-15 DIAGNOSIS — M6281 Muscle weakness (generalized): Secondary | ICD-10-CM | POA: Diagnosis not present

## 2021-03-15 DIAGNOSIS — M25512 Pain in left shoulder: Secondary | ICD-10-CM | POA: Diagnosis not present

## 2021-03-15 DIAGNOSIS — Z4789 Encounter for other orthopedic aftercare: Secondary | ICD-10-CM | POA: Diagnosis not present

## 2021-03-18 DIAGNOSIS — Z4789 Encounter for other orthopedic aftercare: Secondary | ICD-10-CM | POA: Diagnosis not present

## 2021-03-18 DIAGNOSIS — M25512 Pain in left shoulder: Secondary | ICD-10-CM | POA: Diagnosis not present

## 2021-03-18 DIAGNOSIS — M6281 Muscle weakness (generalized): Secondary | ICD-10-CM | POA: Diagnosis not present

## 2021-03-20 DIAGNOSIS — Z4789 Encounter for other orthopedic aftercare: Secondary | ICD-10-CM | POA: Diagnosis not present

## 2021-03-22 DIAGNOSIS — Z20828 Contact with and (suspected) exposure to other viral communicable diseases: Secondary | ICD-10-CM | POA: Diagnosis not present

## 2021-03-22 DIAGNOSIS — Z23 Encounter for immunization: Secondary | ICD-10-CM | POA: Diagnosis not present

## 2021-03-25 DIAGNOSIS — Z4789 Encounter for other orthopedic aftercare: Secondary | ICD-10-CM | POA: Diagnosis not present

## 2021-03-25 DIAGNOSIS — M6281 Muscle weakness (generalized): Secondary | ICD-10-CM | POA: Diagnosis not present

## 2021-03-25 DIAGNOSIS — M25512 Pain in left shoulder: Secondary | ICD-10-CM | POA: Diagnosis not present

## 2021-03-28 DIAGNOSIS — M6281 Muscle weakness (generalized): Secondary | ICD-10-CM | POA: Diagnosis not present

## 2021-03-28 DIAGNOSIS — M25512 Pain in left shoulder: Secondary | ICD-10-CM | POA: Diagnosis not present

## 2021-03-28 DIAGNOSIS — Z4789 Encounter for other orthopedic aftercare: Secondary | ICD-10-CM | POA: Diagnosis not present

## 2021-04-02 DIAGNOSIS — Z4789 Encounter for other orthopedic aftercare: Secondary | ICD-10-CM | POA: Diagnosis not present

## 2021-04-02 DIAGNOSIS — M25512 Pain in left shoulder: Secondary | ICD-10-CM | POA: Diagnosis not present

## 2021-04-02 DIAGNOSIS — M6281 Muscle weakness (generalized): Secondary | ICD-10-CM | POA: Diagnosis not present

## 2021-04-04 DIAGNOSIS — M6281 Muscle weakness (generalized): Secondary | ICD-10-CM | POA: Diagnosis not present

## 2021-04-04 DIAGNOSIS — M25512 Pain in left shoulder: Secondary | ICD-10-CM | POA: Diagnosis not present

## 2021-04-04 DIAGNOSIS — Z4789 Encounter for other orthopedic aftercare: Secondary | ICD-10-CM | POA: Diagnosis not present

## 2021-04-08 DIAGNOSIS — M25512 Pain in left shoulder: Secondary | ICD-10-CM | POA: Diagnosis not present

## 2021-04-08 DIAGNOSIS — Z4789 Encounter for other orthopedic aftercare: Secondary | ICD-10-CM | POA: Diagnosis not present

## 2021-04-08 DIAGNOSIS — M6281 Muscle weakness (generalized): Secondary | ICD-10-CM | POA: Diagnosis not present

## 2021-04-09 DIAGNOSIS — Z23 Encounter for immunization: Secondary | ICD-10-CM | POA: Diagnosis not present

## 2021-04-11 DIAGNOSIS — M6281 Muscle weakness (generalized): Secondary | ICD-10-CM | POA: Diagnosis not present

## 2021-04-11 DIAGNOSIS — Z4789 Encounter for other orthopedic aftercare: Secondary | ICD-10-CM | POA: Diagnosis not present

## 2021-04-11 DIAGNOSIS — M25512 Pain in left shoulder: Secondary | ICD-10-CM | POA: Diagnosis not present

## 2021-04-15 DIAGNOSIS — R5383 Other fatigue: Secondary | ICD-10-CM | POA: Diagnosis not present

## 2021-04-15 DIAGNOSIS — I10 Essential (primary) hypertension: Secondary | ICD-10-CM | POA: Diagnosis not present

## 2021-04-15 DIAGNOSIS — L570 Actinic keratosis: Secondary | ICD-10-CM | POA: Diagnosis not present

## 2021-04-17 DIAGNOSIS — M25512 Pain in left shoulder: Secondary | ICD-10-CM | POA: Diagnosis not present

## 2021-04-17 DIAGNOSIS — M6281 Muscle weakness (generalized): Secondary | ICD-10-CM | POA: Diagnosis not present

## 2021-04-17 DIAGNOSIS — Z4789 Encounter for other orthopedic aftercare: Secondary | ICD-10-CM | POA: Diagnosis not present

## 2021-04-22 DIAGNOSIS — Z20828 Contact with and (suspected) exposure to other viral communicable diseases: Secondary | ICD-10-CM | POA: Diagnosis not present

## 2021-04-30 DIAGNOSIS — M25512 Pain in left shoulder: Secondary | ICD-10-CM | POA: Diagnosis not present

## 2021-04-30 DIAGNOSIS — Z4789 Encounter for other orthopedic aftercare: Secondary | ICD-10-CM | POA: Diagnosis not present

## 2021-04-30 DIAGNOSIS — M6281 Muscle weakness (generalized): Secondary | ICD-10-CM | POA: Diagnosis not present

## 2021-05-07 DIAGNOSIS — Z4789 Encounter for other orthopedic aftercare: Secondary | ICD-10-CM | POA: Diagnosis not present

## 2021-05-07 DIAGNOSIS — M6281 Muscle weakness (generalized): Secondary | ICD-10-CM | POA: Diagnosis not present

## 2021-05-07 DIAGNOSIS — M25512 Pain in left shoulder: Secondary | ICD-10-CM | POA: Diagnosis not present

## 2021-05-10 DIAGNOSIS — Z4789 Encounter for other orthopedic aftercare: Secondary | ICD-10-CM | POA: Diagnosis not present

## 2021-05-10 DIAGNOSIS — M25512 Pain in left shoulder: Secondary | ICD-10-CM | POA: Diagnosis not present

## 2021-05-10 DIAGNOSIS — M6281 Muscle weakness (generalized): Secondary | ICD-10-CM | POA: Diagnosis not present

## 2021-06-28 DIAGNOSIS — M48062 Spinal stenosis, lumbar region with neurogenic claudication: Secondary | ICD-10-CM | POA: Diagnosis not present

## 2021-08-20 DIAGNOSIS — R5383 Other fatigue: Secondary | ICD-10-CM | POA: Diagnosis not present

## 2021-08-20 DIAGNOSIS — M48062 Spinal stenosis, lumbar region with neurogenic claudication: Secondary | ICD-10-CM | POA: Diagnosis not present

## 2021-08-20 DIAGNOSIS — G3184 Mild cognitive impairment, so stated: Secondary | ICD-10-CM | POA: Diagnosis not present

## 2021-08-20 DIAGNOSIS — I10 Essential (primary) hypertension: Secondary | ICD-10-CM | POA: Diagnosis not present

## 2021-08-20 DIAGNOSIS — K219 Gastro-esophageal reflux disease without esophagitis: Secondary | ICD-10-CM | POA: Diagnosis not present

## 2021-08-20 DIAGNOSIS — J45909 Unspecified asthma, uncomplicated: Secondary | ICD-10-CM | POA: Diagnosis not present

## 2021-08-20 DIAGNOSIS — L570 Actinic keratosis: Secondary | ICD-10-CM | POA: Diagnosis not present

## 2021-08-21 DIAGNOSIS — Z20828 Contact with and (suspected) exposure to other viral communicable diseases: Secondary | ICD-10-CM | POA: Diagnosis not present

## 2021-10-01 DIAGNOSIS — Z23 Encounter for immunization: Secondary | ICD-10-CM | POA: Diagnosis not present

## 2021-10-28 DIAGNOSIS — Z1331 Encounter for screening for depression: Secondary | ICD-10-CM | POA: Diagnosis not present

## 2021-10-28 DIAGNOSIS — E039 Hypothyroidism, unspecified: Secondary | ICD-10-CM | POA: Diagnosis not present

## 2021-10-28 DIAGNOSIS — Z0000 Encounter for general adult medical examination without abnormal findings: Secondary | ICD-10-CM | POA: Diagnosis not present

## 2021-10-28 DIAGNOSIS — I10 Essential (primary) hypertension: Secondary | ICD-10-CM | POA: Diagnosis not present

## 2021-10-28 DIAGNOSIS — G3184 Mild cognitive impairment, so stated: Secondary | ICD-10-CM | POA: Diagnosis not present

## 2021-10-28 DIAGNOSIS — G459 Transient cerebral ischemic attack, unspecified: Secondary | ICD-10-CM | POA: Diagnosis not present

## 2021-10-28 DIAGNOSIS — J45909 Unspecified asthma, uncomplicated: Secondary | ICD-10-CM | POA: Diagnosis not present

## 2021-10-29 DIAGNOSIS — M25511 Pain in right shoulder: Secondary | ICD-10-CM | POA: Diagnosis not present

## 2021-10-29 DIAGNOSIS — M25512 Pain in left shoulder: Secondary | ICD-10-CM | POA: Diagnosis not present

## 2021-10-30 DIAGNOSIS — I6523 Occlusion and stenosis of bilateral carotid arteries: Secondary | ICD-10-CM | POA: Diagnosis not present

## 2021-11-04 DIAGNOSIS — M47816 Spondylosis without myelopathy or radiculopathy, lumbar region: Secondary | ICD-10-CM | POA: Diagnosis not present

## 2021-11-12 DIAGNOSIS — M47896 Other spondylosis, lumbar region: Secondary | ICD-10-CM | POA: Diagnosis not present

## 2021-11-28 DIAGNOSIS — M47816 Spondylosis without myelopathy or radiculopathy, lumbar region: Secondary | ICD-10-CM | POA: Diagnosis not present

## 2021-12-11 DIAGNOSIS — M1711 Unilateral primary osteoarthritis, right knee: Secondary | ICD-10-CM | POA: Diagnosis not present

## 2021-12-11 DIAGNOSIS — M25562 Pain in left knee: Secondary | ICD-10-CM | POA: Diagnosis not present

## 2021-12-12 DIAGNOSIS — Z4789 Encounter for other orthopedic aftercare: Secondary | ICD-10-CM | POA: Diagnosis not present

## 2021-12-12 DIAGNOSIS — M47896 Other spondylosis, lumbar region: Secondary | ICD-10-CM | POA: Diagnosis not present

## 2021-12-18 DIAGNOSIS — M1711 Unilateral primary osteoarthritis, right knee: Secondary | ICD-10-CM | POA: Diagnosis not present

## 2021-12-20 DIAGNOSIS — M47816 Spondylosis without myelopathy or radiculopathy, lumbar region: Secondary | ICD-10-CM | POA: Diagnosis not present

## 2022-01-16 DIAGNOSIS — R296 Repeated falls: Secondary | ICD-10-CM | POA: Diagnosis not present

## 2022-01-16 DIAGNOSIS — M47896 Other spondylosis, lumbar region: Secondary | ICD-10-CM | POA: Diagnosis not present

## 2022-01-16 DIAGNOSIS — R410 Disorientation, unspecified: Secondary | ICD-10-CM | POA: Diagnosis not present

## 2022-01-16 DIAGNOSIS — Z79899 Other long term (current) drug therapy: Secondary | ICD-10-CM | POA: Diagnosis not present

## 2022-01-16 DIAGNOSIS — Z5181 Encounter for therapeutic drug level monitoring: Secondary | ICD-10-CM | POA: Diagnosis not present

## 2022-01-16 DIAGNOSIS — G894 Chronic pain syndrome: Secondary | ICD-10-CM | POA: Diagnosis not present

## 2022-01-16 DIAGNOSIS — Z4789 Encounter for other orthopedic aftercare: Secondary | ICD-10-CM | POA: Diagnosis not present

## 2022-02-24 DIAGNOSIS — M25562 Pain in left knee: Secondary | ICD-10-CM | POA: Diagnosis not present

## 2022-02-24 DIAGNOSIS — M25512 Pain in left shoulder: Secondary | ICD-10-CM | POA: Diagnosis not present

## 2022-02-25 ENCOUNTER — Emergency Department (HOSPITAL_BASED_OUTPATIENT_CLINIC_OR_DEPARTMENT_OTHER): Payer: Medicare Other

## 2022-02-25 ENCOUNTER — Other Ambulatory Visit: Payer: Self-pay

## 2022-02-25 ENCOUNTER — Encounter (HOSPITAL_BASED_OUTPATIENT_CLINIC_OR_DEPARTMENT_OTHER): Payer: Self-pay

## 2022-02-25 ENCOUNTER — Emergency Department (HOSPITAL_BASED_OUTPATIENT_CLINIC_OR_DEPARTMENT_OTHER)
Admission: EM | Admit: 2022-02-25 | Discharge: 2022-02-25 | Disposition: A | Discharge status: Home / Self Care | Payer: Medicare Other | Attending: Emergency Medicine | Admitting: Emergency Medicine

## 2022-02-25 ENCOUNTER — Emergency Department (HOSPITAL_BASED_OUTPATIENT_CLINIC_OR_DEPARTMENT_OTHER): Payer: Medicare Other | Admitting: Radiology

## 2022-02-25 DIAGNOSIS — Z043 Encounter for examination and observation following other accident: Secondary | ICD-10-CM | POA: Diagnosis not present

## 2022-02-25 DIAGNOSIS — S20212A Contusion of left front wall of thorax, initial encounter: Secondary | ICD-10-CM | POA: Diagnosis not present

## 2022-02-25 DIAGNOSIS — S0083XA Contusion of other part of head, initial encounter: Secondary | ICD-10-CM | POA: Diagnosis not present

## 2022-02-25 DIAGNOSIS — G319 Degenerative disease of nervous system, unspecified: Secondary | ICD-10-CM | POA: Diagnosis not present

## 2022-02-25 DIAGNOSIS — M25562 Pain in left knee: Secondary | ICD-10-CM | POA: Insufficient documentation

## 2022-02-25 DIAGNOSIS — S299XXA Unspecified injury of thorax, initial encounter: Secondary | ICD-10-CM | POA: Diagnosis present

## 2022-02-25 DIAGNOSIS — W108XXA Fall (on) (from) other stairs and steps, initial encounter: Secondary | ICD-10-CM | POA: Diagnosis not present

## 2022-02-25 DIAGNOSIS — S0012XA Contusion of left eyelid and periocular area, initial encounter: Secondary | ICD-10-CM | POA: Diagnosis not present

## 2022-02-25 DIAGNOSIS — S40012A Contusion of left shoulder, initial encounter: Secondary | ICD-10-CM | POA: Diagnosis not present

## 2022-02-25 DIAGNOSIS — S0993XA Unspecified injury of face, initial encounter: Secondary | ICD-10-CM | POA: Diagnosis not present

## 2022-02-25 DIAGNOSIS — Z79899 Other long term (current) drug therapy: Secondary | ICD-10-CM | POA: Diagnosis not present

## 2022-02-25 NOTE — ED Triage Notes (Signed)
Pt had a fall Sunday evening. Pt got caught on some wildflowers on the sidewalk.  Pt had a nosebleed that has since resolved. Pt had knees checked out by [facility] (MRI) Requesting head CT.  No LOC. Pt did hit head, no thinners.

## 2022-02-25 NOTE — Discharge Instructions (Signed)
You can use Tylenol for symptomatic relief.  Follow-up with your primary care doctor if your symptoms are not improving.  Continue to follow-up with the orthopedist regarding your shoulder injury.  Return to the emergency room if you have any worsening symptoms.

## 2022-02-25 NOTE — ED Provider Notes (Signed)
[facility] DE[facility] Provider Note   CSN: [id number] Arrival date & time: 02/25/22  1711     History  Chief Complaint  Patient presents with   Fall    Gabriel Mcdonald is a 88 y.o. male.  Patient is a 88 year old male who presents after a fall.  The fall happened 2 days ago.  He was walking down some stairs and tripped and fell onto his side walk.  He did hit his head and the left side of his face.  He had injuries to his left shoulder and left knee.  He is not currently on anticoagulants other than aspirin.  On chart review, it looks like he was previously on Xarelto but is no longer on this.  This was after an orthopedic surgery per family who is at bedside.  Patient was seen at [facility] for his left shoulder pain and his left knee pain.  The images they have done were reported to be normal per the family.  They did get an MRI of his shoulder.  He has had this done but they do not have the results yet.  He came in today because he wanted to get a CT scan of his head.  He also has some pain in his face.  He denies any neck or back pain other than some chronic pain in his neck which appears to be unchanged.  He denies any other injuries.  He did have a nosebleed after this happened but has not had any since that time.       Home Medications Prior to Admission medications   Medication Sig Start Date End Date Taking? Authorizing Provider  acetaminophen (TYLENOL) 500 MG tablet Take 1-2 tablets (500-1,000 mg total) by mouth every 8 (eight) hours as needed for mild pain or moderate pain. 08/29/20   Edmisten, Kristie L, PA  albuterol (PROVENTIL HFA;VENTOLIN HFA) 108 (90 BASE) MCG/ACT inhaler Inhale 2 puffs into the lungs every 6 (six) hours as needed for shortness of breath or wheezing.    [provider]  amLODipine (NORVASC) 5 MG tablet Take 5 mg by mouth daily.     [provider]  atorvastatin (LIPITOR) 10 MG tablet Take 10 mg by mouth every evening.      [provider]  FEROSUL 325 (65 Fe) MG tablet Take 325 mg by mouth 2 (two) times daily. 05/16/20   [provider]  folic acid (FOLVITE) 1 MG tablet Take 1 mg by mouth daily.    [provider]  lisinopril (ZESTRIL) 20 MG tablet Take 20 mg by mouth daily.     [provider]  LORazepam (ATIVAN) 1 MG tablet Take 0.5 mg by mouth daily as needed for anxiety.     [provider]  methocarbamol (ROBAXIN) 500 MG tablet Take 1 tablet (500 mg total) by mouth every 6 (six) hours as needed for muscle spasms. 08/29/20   Edmisten, Kristie L, PA  metoprolol succinate (TOPROL-XL) 50 MG 24 hr tablet Take 50 mg by mouth every other day. Take with or immediately following a meal.    [provider]  omeprazole (PRILOSEC) 20 MG capsule Take 20 mg by mouth daily.    [provider]  polyethylene glycol powder (GLYCOLAX/MIRALAX) 17 GM/SCOOP powder Take 17 g by mouth daily as needed for moderate constipation.    [provider]  rivaroxaban (XARELTO) 10 MG TABS tablet Take 1 tablet (10 mg total) by mouth daily with breakfast for 19 days.  Then resume one 81 mg aspirin once a day. 08/30/20 09/18/20  Edmisten, Ok Anis, PA  Soft Lens Products (REFRESH CONTACTS DROPS) SOLN Place 1 drop into both eyes daily as needed (dry eyes).     [provider]  tamsulosin (FLOMAX) 0.4 MG CAPS capsule Take 0.4 mg by mouth every evening.  01/19/17   [provider]  theophylline (THEODUR) 200 MG 12 hr tablet Take 400 mg by mouth every morning.     [provider]  traMADol (ULTRAM) 50 MG tablet Take 50 mg by mouth every 6 (six) hours as needed for moderate pain.  12/12/16   [provider]      Allergies    Patient has no known allergies.    Review of Systems   Review of Systems  Constitutional:  Negative for activity change, appetite change and fever.  HENT:  Positive for nosebleeds. Negative for dental problem and trouble  swallowing.   Eyes:  Negative for pain and visual disturbance.  Respiratory:  Negative for shortness of breath.   Cardiovascular:  Negative for chest pain.  Gastrointestinal:  Negative for abdominal pain, nausea and vomiting.  Genitourinary:  Negative for dysuria and hematuria.  Musculoskeletal:  Positive for arthralgias. Negative for back pain, joint swelling and neck pain.  Skin:  Negative for wound.  Neurological:  Negative for weakness, numbness and headaches.  Psychiatric/Behavioral:  Negative for confusion.     Physical Exam Updated Vital Signs BP (!) 171/61   Pulse (!) 52   Temp 98.4 F (36.9 C) (Oral)   Resp 16   Ht 6' (1.829 m)   Wt 86.2 kg   SpO2 100%   BMI 25.77 kg/m  Physical Exam Constitutional:      Appearance: He is well-developed.  HENT:     Head: Normocephalic.     Comments: Ecchymosis to the left periorbital area and along the left maxilla.  There is some tenderness over the left maxilla, extraocular eye movements are intact.  The eye looks normal.  There is no injection.    Nose:     Comments: No bleeding, no septal hematoma Eyes:     Pupils: Pupils are equal, round, and reactive to light.  Neck:     Comments: Tenderness along the upper thoracic spine and lower cervical spine.  There is no pain to the lumbosacral spine.  No step-offs or deformities. Cardiovascular:     Rate and Rhythm: Normal rate and regular rhythm.     Heart sounds: Normal heart sounds.  Pulmonary:     Effort: Pulmonary effort is normal. No respiratory distress.     Breath sounds: Normal breath sounds. No wheezing or rales.  Chest:     Chest wall: Tenderness (There is some ecchymosis to the left anterior chest wall, no crepitus or deformity.  There are some mild tenderness under this area.) present.  Abdominal:     General: Bowel sounds are normal.     Palpations: Abdomen is soft.     Tenderness: There is no abdominal tenderness. There is no guarding or rebound.  Musculoskeletal:         General: Normal range of motion.     Comments: Positive ecchymosis to the left anterior shoulder with some tenderness to this area.  There is some mild pain to the left knee.  No other pain on palpation or range of motion of the extremities.  Lymphadenopathy:     Cervical: No cervical adenopathy.  Skin:    General:  Skin is warm and dry.     Findings: No rash.  Neurological:     Mental Status: He is alert and oriented to person, place, and time.     ED Results / Procedures / Treatments   Labs (all labs ordered are listed, but only abnormal results are displayed) Labs Reviewed - No data to display  EKG None  Radiology CT Cervical Spine Wo Contrast  Result Date: 02/25/2022 CLINICAL DATA:  Fall EXAM: CT CERVICAL SPINE WITHOUT CONTRAST CT THORACIC SPINE WITHOUT CONTRAST TECHNIQUE: Multidetector CT imaging of the cervical and thoracic spine was performed without contrast. Multiplanar CT image reconstructions were also generated. RADIATION DOSE REDUCTION: This exam was performed according to the departmental dose-optimization program which includes automated exposure control, adjustment of the mA and/or kV according to patient size and/or use of iterative reconstruction technique. COMPARISON:  None Available. FINDINGS: CT CERVICAL SPINE FINDINGS Alignment: Normal. Skull base and vertebrae: No acute fracture. No primary bone lesion or focal pathologic process. Soft tissues and spinal canal: No prevertebral fluid or swelling. No visible canal hematoma. Disc levels: Severe spinal canal stenosis at C3-4 due to combination of disc bulge and uncovertebral hypertrophy. Upper chest: Negative. Other: None. CT THORACIC SPINE FINDINGS Alignment: Normal. Vertebrae: No acute fracture or focal pathologic process. Paraspinal and other soft tissues: Calcific aortic atherosclerosis. Disc levels: No bony spinal canal stenosis. IMPRESSION: 1. No acute fracture or static subluxation of the cervical or thoracic  spine. 2. Severe spinal canal stenosis at C3-4 due to combination of disc bulge and uncovertebral hypertrophy. 3. No bony spinal canal stenosis of the thoracic spine. 4.  Aortic Atherosclerosis (ICD10-I70.0). Electronically Signed   By: Ulyses Jarred M.D.   On: 02/25/2022 20:25   CT Thoracic Spine Wo Contrast  Result Date: 02/25/2022 CLINICAL DATA:  Fall EXAM: CT CERVICAL SPINE WITHOUT CONTRAST CT THORACIC SPINE WITHOUT CONTRAST TECHNIQUE: Multidetector CT imaging of the cervical and thoracic spine was performed without contrast. Multiplanar CT image reconstructions were also generated. RADIATION DOSE REDUCTION: This exam was performed according to the departmental dose-optimization program which includes automated exposure control, adjustment of the mA and/or kV according to patient size and/or use of iterative reconstruction technique. COMPARISON:  None Available. FINDINGS: CT CERVICAL SPINE FINDINGS Alignment: Normal. Skull base and vertebrae: No acute fracture. No primary bone lesion or focal pathologic process. Soft tissues and spinal canal: No prevertebral fluid or swelling. No visible canal hematoma. Disc levels: Severe spinal canal stenosis at C3-4 due to combination of disc bulge and uncovertebral hypertrophy. Upper chest: Negative. Other: None. CT THORACIC SPINE FINDINGS Alignment: Normal. Vertebrae: No acute fracture or focal pathologic process. Paraspinal and other soft tissues: Calcific aortic atherosclerosis. Disc levels: No bony spinal canal stenosis. IMPRESSION: 1. No acute fracture or static subluxation of the cervical or thoracic spine. 2. Severe spinal canal stenosis at C3-4 due to combination of disc bulge and uncovertebral hypertrophy. 3. No bony spinal canal stenosis of the thoracic spine. 4.  Aortic Atherosclerosis (ICD10-I70.0). Electronically Signed   By: Ulyses Jarred M.D.   On: 02/25/2022 20:25   CT Maxillofacial WO CM  Result Date: 02/25/2022 CLINICAL DATA:  Recent fall with  facial trauma, initial encounter EXAM: CT MAXILLOFACIAL WITHOUT CONTRAST TECHNIQUE: Multidetector CT imaging of the maxillofacial structures was performed. Multiplanar CT image reconstructions were also generated. RADIATION DOSE REDUCTION: This exam was performed according to the departmental dose-optimization program which includes automated exposure control, adjustment of the mA and/or kV according to patient size and/or use  of iterative reconstruction technique. COMPARISON:  None Available. FINDINGS: Osseous: Bony structures show no acute fracture. Orbits: Orbits and their contents are within normal limits. Sinuses: Paranasal sinuses are well aerated. No mucosal changes are seen. No mastoid effusion is identified. Soft tissues: Surrounding soft tissue structures are within normal limits. No focal hematoma is seen. Limited intracranial: Mild atrophic changes are noted similar to that seen on recent head CT. IMPRESSION: No acute bony abnormality noted. Electronically Signed   By: Inez Catalina M.D.   On: 02/25/2022 20:16   DG Ribs Unilateral W/Chest Left  Result Date: 02/25/2022 CLINICAL DATA:  Status post fall. EXAM: LEFT RIBS AND CHEST - 3+ VIEW COMPARISON:  December 30, 2013 FINDINGS: A radiopaque marker was placed at the site of the patient's pain. No fracture or other bone lesions are seen involving the ribs. Multiple sternal wires and vascular clips are noted. There is no evidence of pneumothorax or pleural effusion. Both lungs are clear. Heart size and mediastinal contours are within normal limits. IMPRESSION: Negative. Electronically Signed   By: Virgina Norfolk M.D.   On: 02/25/2022 18:50   CT Head Wo Contrast  Result Date: 02/25/2022 CLINICAL DATA:  Status post recent fall. EXAM: CT HEAD WITHOUT CONTRAST TECHNIQUE: Contiguous axial images were obtained from the base of the skull through the vertex without intravenous contrast. RADIATION DOSE REDUCTION: This exam was performed according to the  departmental dose-optimization program which includes automated exposure control, adjustment of the mA and/or kV according to patient size and/or use of iterative reconstruction technique. COMPARISON:  January 16, 2021 FINDINGS: Brain: There is mild cerebral atrophy with widening of the extra-axial spaces and ventricular dilatation. There are areas of decreased attenuation within the white matter tracts of the supratentorial brain, consistent with microvascular disease changes. Vascular: No hyperdense vessel or unexpected calcification. Skull: Normal. Negative for fracture or focal lesion. Sinuses/Orbits: No acute finding. Other: None. IMPRESSION: 1. No acute intracranial abnormality. 2. Generalized cerebral atrophy and microvascular disease changes of the supratentorial brain. Electronically Signed   By: Virgina Norfolk M.D.   On: 02/25/2022 18:35    Procedures Procedures    Medications Ordered in ED Medications - No data to display  ED Course/ Medical Decision Making/ A&P                           Medical Decision Making Problems Addressed: Contusion of face, initial encounter: acute illness or injury Contusion of left shoulder, initial encounter: undiagnosed new problem with uncertain prognosis Contusion of rib on left side, initial encounter: acute illness or injury  Amount and/or Complexity of Data Reviewed Independent Historian:     Details: Daughter External Data Reviewed: notes. Radiology: ordered and independent interpretation performed. Decision-making details documented in ED Course.  Risk OTC drugs. Decision regarding hospitalization.   Patient is a 88 year old male who presents after a fall 2 days ago.  He has had his shoulder and knees looked out already but was concerned about his head and wanted to get a CT scan of his head.  He had a CT scan of his head, maxillofacial bones, CT of his cervical spine and thoracic bones.  There is no intracranial hemorrhage.  No evidence  of bony injuries or fractures.  X-ray of his left ribs were obtained.  These were interpreted by me and confirmed by the radiologist that show no acute fractures.  No pneumothorax.  He is otherwise well-appearing.  On chart review, I  cannot see any of the notes from [facility] or any of the imaging studies.  However they are reported to be negative by family.  These were not repeated tonight.  Patient is otherwise well-appearing.  There is no indication for admission.  He was discharged home in good condition.  Symptomatic care instructions and return precautions were given.  Final Clinical Impression(s) / ED Diagnoses Final diagnoses:  Contusion of face, initial encounter  Contusion of left shoulder, initial encounter  Contusion of rib on left side, initial encounter    Rx / DC Orders ED Discharge Orders     None         Malvin Johns, MD 02/25/22 2137

## 2022-02-26 DIAGNOSIS — M75121 Complete rotator cuff tear or rupture of right shoulder, not specified as traumatic: Secondary | ICD-10-CM | POA: Diagnosis not present

## 2022-03-03 DIAGNOSIS — Z23 Encounter for immunization: Secondary | ICD-10-CM | POA: Diagnosis not present

## 2022-03-15 DIAGNOSIS — Z23 Encounter for immunization: Secondary | ICD-10-CM | POA: Diagnosis not present

## 2022-04-02 DIAGNOSIS — M25512 Pain in left shoulder: Secondary | ICD-10-CM | POA: Diagnosis not present

## 2022-04-30 NOTE — Patient Instructions (Addendum)
SURGICAL WAITING ROOM VISITATION Patients having surgery or a procedure may have no more than 2 support people in the waiting area - these visitors may rotate in the visitor waiting room.   Children under the age of 59 must have an adult with them who is not the patient. If the patient needs to stay at the hospital during part of their recovery, the visitor guidelines for inpatient rooms apply.  PRE-OP VISITATION  Pre-op nurse will coordinate an appropriate time for 1 support person to accompany the patient in pre-op.  This support person may not rotate.  This visitor will be contacted when the time is appropriate for the visitor to come back in the pre-op area.  Please refer to the [facility] website for the visitor guidelines for Inpatients (after your surgery is over and you are in a regular room).  You are not required to quarantine at this time prior to your surgery. However, you must do this: Hand Hygiene often Do NOT share personal items Notify your provider if you are in close contact with someone who has COVID or you develop fever 100.4 or greater, new onset of sneezing, cough, sore throat, shortness of breath or body aches.  If you test positive for Covid or have been in contact with anyone that has tested positive in the last 10 days please notify you surgeon.    Your procedure is scheduled HQ:[email or web address]  May 15, 2022    Report to [facility] Main Entrance: [place] entrance where the [employer] is available.   Report to admitting at: 07:30  AM  +++++Call this number if you have any questions or problems the morning of surgery [phone]  Do not eat food after Midnight the night prior to your surgery/procedure.  After Midnight you may have the following liquids until   07:00   AM / PM DAY OF SURGERY  Clear Liquid Diet Water Black Coffee (sugar ok, NO MILK/CREAM OR CREAMERS)  Tea (sugar ok, NO MILK/CREAM OR CREAMERS) regular and decaf                              Plain Jell-O  with no fruit (NO RED)                                           Fruit ices (not with fruit pulp, NO RED)                                     Popsicles (NO RED)                                                                  Juice: apple, WHITE grape, WHITE cranberry Sports drinks like Gatorade or Powerade (NO RED)                   The day of surgery:  Drink ONE (1) Pre-Surgery Clear Ensure at  07:00  AM the morning of surgery. Drink in one sitting. Do not sip.  This  drink was given to you during your hospital pre-op appointment visit. Nothing else to drink after completing the Pre-Surgery Clear Ensure : No candy, chewing gum or throat lozenges.    FOLLOW  ANY ADDITIONAL PRE OP INSTRUCTIONS YOU RECEIVED FROM YOUR SURGEON'S OFFICE!!!   Oral Hygiene is also important to reduce your risk of infection.        Remember - BRUSH YOUR TEETH THE MORNING OF SURGERY WITH YOUR REGULAR TOOTHPASTE   Take ONLY these medicines the morning of surgery with A SIP OF WATER: Lorazepam, Levothyroxine (Synthroid), omeprazole  and Tylenol if needed. You may use your inhalers if needed.    You may not have any metal on your body including jewelry, and body piercing  Do not wear  lotions, powders,  cologne, or deodorant  Men may shave face and neck.  Patients discharged on the day of surgery will not be allowed to drive home.  Someone NEEDS to stay with you for the first 24 hours after anesthesia.  Do not bring your home medications to the hospital. The Pharmacy will dispense medications listed on your medication list to you during your admission in the Hospital.  Special Instructions: Bring a copy of your healthcare power of attorney and living will documents the day of surgery, if you wish to have them scanned into your [place] Medical Records- EPIC  Please read over the following fact sheets you were given: IF YOU HAVE QUESTIONS ABOUT YOUR PRE-OP INSTRUCTIONS, PLEASE CALL  [phone]  ([place])   [place] - Preparing for Surgery Before surgery, you can play an important role.  Because skin is not sterile, your skin needs to be as free of germs as possible.  You can reduce the number of germs on your skin by washing with CHG (chlorahexidine gluconate) soap before surgery.  CHG is an antiseptic cleaner which kills germs and bonds with the skin to continue killing germs even after washing. Please DO NOT use if you have an allergy to CHG or antibacterial soaps.  If your skin becomes reddened/irritated stop using the CHG and inform your nurse when you arrive at Short Stay. Do not shave (including legs and underarms) for at least 48 hours prior to the first CHG shower.  You may shave your face/neck.  Please follow these instructions carefully:  1.  Shower with CHG Soap the night before surgery and the  morning of surgery.  2.  If you choose to wash your hair, wash your hair first as usual with your normal  shampoo.  3.  After you shampoo, rinse your hair and body thoroughly to remove the shampoo.                             4.  Use CHG as you would any other liquid soap.  You can apply chg directly to the skin and wash.  Gently with a scrungie or clean washcloth.  5.  Apply the CHG Soap to your body ONLY FROM THE NECK DOWN.   Do not use on face/ open                           Wound or open sores. Avoid contact with eyes, ears mouth and genitals (private parts).                       Wash face,  Development worker, international aid (private parts)  with your normal soap.             6.  Wash thoroughly, paying special attention to the area where your  surgery  will be performed.  7.  Thoroughly rinse your body with warm water from the neck down.  8.  DO NOT shower/wash with your normal soap after using and rinsing off the CHG Soap.            9.  Pat yourself dry with a clean towel.            10.  Wear clean pajamas.            11.  Place clean sheets on your bed the night of your first shower and  do not  sleep with pets.  ON THE DAY OF SURGERY : Do not apply any lotions/deodorants the morning of surgery.  Please wear clean clothes to the hospital/surgery center.    Preparing for Total Shoulder Arthroplasty ================================================================= Please follow these instructions carefully, in addition to any other special Bathing information that was explained to you at the Presurgical Appointment:  BENZOYL PEROXIDE 5% GEL: Used to kill bacteria on the skin which could cause an infection at the surgery site.   Please do not use if you have an allergy to benzoyl peroxide. If your skin becomes reddened/irritated stop using the benzoyl peroxide and inform your Doctor.   Starting two days before surgery, apply as follows:  1. Apply benzoyl peroxide gel in the morning and at night. Apply after taking a shower. If you are not taking a shower, clean entire shoulder front, back, and side, along with the armpit with a clean wet washcloth.  2. Place a quarter-sized dollop of the gel on your SHOULDER and rub in thoroughly, making sure to cover the front, back, and side of your shoulder, along with the armpit.   2 Days prior to Surgery        Tuesday  05-13-2022 First Dose  _______ Morning Second Dose  _______ Night  Day Before Surgery              Wednesday  05-14-2022 First Dose  ______ Morning  On the night before surgery, wash your entire body (except hair, face and private areas) with CHG Soap. THEN, rub in the LAST application of the Benzoyl Peroxide Gel on your shoulder.   3. On the Morning of Surgery wash your BODY AGAIN with CHG Soap (except hair, face and private areas)  4. DO NOT USE THE BENZOYL PEROXIDE GEL ON THE DAY OF YOUR SURGERY      FAILURE TO FOLLOW THESE INSTRUCTIONS MAY RESULT IN THE CANCELLATION OF YOUR SURGERY  PATIENT SIGNATURE_________________________________  NURSE  SIGNATURE__________________________________  ________________________________________________________________________       Gabriel Mcdonald    An incentive spirometer is a tool that can help keep your lungs clear and active. This tool measures how well you are filling your lungs with each breath. Taking long deep breaths may help reverse or decrease the chance of developing breathing (pulmonary) problems (especially infection) following: A long period of time when you are unable to move or be active. BEFORE THE PROCEDURE  If the spirometer includes an indicator to show your best effort, your nurse or respiratory therapist will set it to a desired goal. If possible, sit up straight or lean slightly forward. Try not to slouch. Hold the incentive spirometer in an upright position. INSTRUCTIONS FOR USE  Sit on the edge of your  bed if possible, or sit up as far as you can in bed or on a chair. Hold the incentive spirometer in an upright position. Breathe out normally. Place the mouthpiece in your mouth and seal your lips tightly around it. Breathe in slowly and as deeply as possible, raising the piston or the ball toward the top of the column. Hold your breath for 3-5 seconds or for as long as possible. Allow the piston or ball to fall to the bottom of the column. Remove the mouthpiece from your mouth and breathe out normally. Rest for a few seconds and repeat Steps 1 through 7 at least 10 times every 1-2 hours when you are awake. Take your time and take a few normal breaths between deep breaths. The spirometer may include an indicator to show your best effort. Use the indicator as a goal to work toward during each repetition. After each set of 10 deep breaths, practice coughing to be sure your lungs are clear. If you have an incision (the cut made at the time of surgery), support your incision when coughing by placing a pillow or rolled up towels firmly against it. Once you are able to  get out of bed, walk around indoors and cough well. You may stop using the incentive spirometer when instructed by your caregiver.  RISKS AND COMPLICATIONS Take your time so you do not get dizzy or light-headed. If you are in pain, you may need to take or ask for pain medication before doing incentive spirometry. It is harder to take a deep breath if you are having pain. AFTER USE Rest and breathe slowly and easily. It can be helpful to keep track of a log of your progress. Your caregiver can provide you with a simple table to help with this. If you are using the spirometer at home, follow these instructions: [place] IF:  You are having difficultly using the spirometer. You have trouble using the spirometer as often as instructed. Your pain medication is not giving enough relief while using the spirometer. You develop fever of 100.5 F (38.1 C) or higher.                                                                                                    SEEK IMMEDIATE MEDICAL CARE IF:  You cough up bloody sputum that had not been present before. You develop fever of 102 F (38.9 C) or greater. You develop worsening pain at or near the incision site. MAKE SURE YOU:  Understand these instructions. Will watch your condition. Will get help right away if you are not doing well or get worse. Document Released: 09/22/2006 Document Revised: 08/04/2011 Document Reviewed: 11/23/2006 [facility] Patient Information 2014 [place], [place].

## 2022-05-05 ENCOUNTER — Other Ambulatory Visit: Payer: Self-pay

## 2022-05-05 ENCOUNTER — Encounter (HOSPITAL_COMMUNITY)
Admission: RE | Admit: 2022-05-05 | Discharge: 2022-05-05 | Disposition: A | Discharge status: Home / Self Care | Payer: Medicare Other | Source: Ambulatory Visit | Attending: Orthopedic Surgery | Admitting: Orthopedic Surgery

## 2022-05-05 ENCOUNTER — Encounter (HOSPITAL_COMMUNITY): Payer: Self-pay

## 2022-05-05 VITALS — BP 124/67 | HR 92 | Temp 98.0°F | Resp 16 | Ht 72.0 in | Wt 180.0 lb

## 2022-05-05 DIAGNOSIS — Z01818 Encounter for other preprocedural examination: Secondary | ICD-10-CM | POA: Insufficient documentation

## 2022-05-05 DIAGNOSIS — I2510 Atherosclerotic heart disease of native coronary artery without angina pectoris: Secondary | ICD-10-CM | POA: Insufficient documentation

## 2022-05-05 DIAGNOSIS — R9431 Abnormal electrocardiogram [ECG] [EKG]: Secondary | ICD-10-CM | POA: Insufficient documentation

## 2022-05-05 HISTORY — DX: Anemia, unspecified: D64.9

## 2022-05-05 HISTORY — DX: Unspecified osteoarthritis, unspecified site: M19.90

## 2022-05-05 HISTORY — DX: Personal history of urinary calculi: Z87.442

## 2022-05-05 LAB — CBC
HCT: 35.3 % — ABNORMAL LOW (ref 39.0–52.0)
Hemoglobin: 12.2 g/dL — ABNORMAL LOW (ref 13.0–17.0)
MCH: 32.3 pg (ref 26.0–34.0)
MCHC: 34.6 g/dL (ref 30.0–36.0)
MCV: 93.4 fL (ref 80.0–100.0)
Platelets: 216 10*3/uL (ref 150–400)
RBC: 3.78 MIL/uL — ABNORMAL LOW (ref 4.22–5.81)
RDW: 13.4 % (ref 11.5–15.5)
WBC: 3.6 10*3/uL — ABNORMAL LOW (ref 4.0–10.5)
nRBC: 0 % (ref 0.0–0.2)

## 2022-05-05 LAB — BASIC METABOLIC PANEL
Anion gap: 7 (ref 5–15)
BUN: 15 mg/dL (ref 8–23)
CO2: 24 mmol/L (ref 22–32)
Calcium: 9.4 mg/dL (ref 8.9–10.3)
Chloride: 108 mmol/L (ref 98–111)
Creatinine, Ser: 1.12 mg/dL (ref 0.61–1.24)
GFR, Estimated: >60 mL/min (ref >60)
Glucose, Bld: 112 mg/dL — ABNORMAL HIGH (ref 70–99)
Potassium: 3.9 mmol/L (ref 3.5–5.1)
Sodium: 139 mmol/L (ref 135–145)

## 2022-05-05 LAB — SURGICAL PCR SCREEN
MRSA, PCR: NEGATIVE
Staphylococcus aureus: POSITIVE — AB

## 2022-05-05 NOTE — Progress Notes (Addendum)
COVID Vaccine received:  _0  No _1  Yes Date of any COVID positive Test in last 90 days: none  PCP - Stoney Bang, MD Clearance on chart Cardiologist - none now, last seen at [facility]  Chest x-ray -  EKG -  08-28-20   will repeat at PST Stress Test -  ECHO -  Cardiac Cath - 2011 with stents [facility]G done at [facility]  PCR screen: _2  Ordered & Completed                      _3   No Order but Needs PROFEND                      _4   N/A for this surgery  Surgery Plan:  _5  Ambulatory                            _6  Outpatient in bed                            _7  Admit  Anesthesia:    _8  General  _9  Spinal                           _10   Choice _11   MAC  Pacemaker / ICD device _12  No _13  Yes        Device order form faxed _14  No    _15   Yes      Faxed to:  Spinal Cord Stimulator:_16  No _17  Yes      (Remind patient to bring remote DOS) Other Implants:   History of Sleep Apnea? _18  No _19  Yes   CPAP used?- _20  No _21  Yes    Does the patient monitor blood sugar? _22  No _23  Yes  _24  N/A  Blood Thinner / Instructions: not on Xarelto/ only took for a month after knee surgery Aspirin Instructions:ASA 81 mg hold x 7 days  ERAS Protocol Ordered: _25  No  _26  Yes PRE-SURGERY _27  ENSURE  _28  G2  Patient is to be NPO after:   Comments: His S/O, Smitty Knudsen, needs to go to preop with patient to assist him. Some Dementia and memory issues.   Activity level: Patient can not climb a flight of stairs without difficulty; _29  No CP but would have _SOB   Patient can perform ADLs without assistance.   Anesthesia review: CAD- CABG 1991, HTN, COPD, TIA  Patient denies shortness of breath, fever, cough and chest pain at PAT appointment.  Patient verbalized understanding and agreement to the Pre-Surgical Instructions that were given to them at this PAT appointment. Patient was also educated of the need to review these PAT instructions again prior to his/her surgery.I reviewed the appropriate phone numbers to  call if they have any and questions or concerns.

## 2022-05-05 NOTE — Progress Notes (Addendum)
Patient's PCR screen is positive for STAPH. Appropriate notes have been placed on the patient's chart. This note has been routed to Dr. Onnie Graham for review. The Patient's surgery is currently scheduled for:  Thursday 05-15-2022     at [facility].    Gabriel Mcdonald, BSN, CVRN-BC   Pre-Surgical Testing Nurse [place]  [phone]

## 2022-05-06 NOTE — Progress Notes (Signed)
Anesthesia Chart Review   Case: [id number] Date/Time: 05/15/22 0945   Procedure: REVERSE SHOULDER ARTHROPLASTY (Left: Shoulder) - 185mn   Anesthesia type: General   Pre-op diagnosis: Left shoulder rotator cuff tear arthropathy   Location: Gabriel Mcdonald 06 / [facility] OR[facility]   Surgeons: SJustice Britain MD       DISCUSSION:88 y.o. never smoker with h/o HTN, CAD (CABG 1991, PCI to RCA 2007), COPD, left shoulder rotator cuff arthropathy scheduled for above procedure 05/15/2022 with Dr. KJustice Britain   Pt last seen by cardiology 07/06/2020.  He was stable at that visit.  Given clearance for a knee procedure at that visit without further cardiac testing.  6 month follow up recommended at that time.  He has not been seen by cardiology since.    He has followed with PCP, PCP sent clearance which states he is stable from medical/cardiac standpoint.   Carotid Duplex 10/30/2021 with right ICA narrowing less than 50%, left ICA stenosis estimated at 50-69%.   Discussed with Dr. TGifford Shave per Dr. TGifford Shaveok to proceed without further cardiac input.  VS: BP 124/67   Pulse 92   Temp 36.7 C (Oral)   Resp 16   Ht 6' (1.829 m)   Wt 81.6 kg   SpO2 100%   BMI 24.41 kg/m   PROVIDERS: HNeale Burly MD is PCP    LABS: Labs reviewed: Acceptable for surgery. (all labs ordered are listed, but only abnormal results are displayed)  Labs Reviewed  SURGICAL PCR SCREEN - Abnormal; Notable for the following components:      Result Value   Staphylococcus aureus POSITIVE (*)    All other components within normal limits  BASIC METABOLIC PANEL - Abnormal; Notable for the following components:   Glucose, Bld 112 (*)    All other components within normal limits  CBC - Abnormal; Notable for the following components:   WBC 3.6 (*)    RBC 3.78 (*)    Hemoglobin 12.2 (*)    HCT 35.3 (*)    All other components within normal limits     IMAGES:   EKG:   CV:  Past Medical History:  Diagnosis Date   Anemia    Arthritis     Cancer (HCC)    prostate   Cataract    COPD (chronic obstructive pulmonary disease) (HCC)    Pleural thickening per pt, pt reports exposure to asbestos   Coronary artery disease    GERD (gastroesophageal reflux disease)    mild   High cholesterol    History of kidney stones    Hypertension    Shortness of breath     Past Surgical History:  Procedure Laterality Date   CATARACT EXTRACTION W/PHACO  04/29/2012   Procedure: CATARACT EXTRACTION PHACO AND INTRAOCULAR LENS PLACEMENT [place]  Surgeon: KTonny Branch MD;  Location: [facility];  Service: Ophthalmology;  Laterality: Right;  CDE:10.09   CATARACT EXTRACTION W/PHACO  06/07/2012   Procedure: CATARACT EXTRACTION PHACO AND INTRAOCULAR LENS PLACEMENT ([facility]);  Surgeon: KTonny Branch MD;  Location: [facility];  Service: Ophthalmology;  Laterality: Left;  CDE:16.19   CORONARY ANGIOPLASTY  2011   stents   CORONARY ARTERY BYPASS GRAFT  [facility]  EYE SURGERY     LUMBAR LAMINECTOMY/DECOMPRESSION MICRODISCECTOMY Right 05/03/2013   Procedure: RIGHT LUMBAR DISCECTOMY LUMBAR FOUR-FIVE;  Surgeon: EFloyce Stakes MD;  [facility];  Service: Neurosurgery;  Laterality: Right;  right    Pylonodial cyst removal  from tip of spine   TONSILLECTOMY     TOTAL KNEE ARTHROPLASTY Left 08/27/2020   Procedure: TOTAL KNEE ARTHROPLASTY;  Surgeon: Gaynelle Arabian, MD;  Location: [facility];  Service: Orthopedics;  Laterality: Left;  71mn    MEDICATIONS:  acetaminophen (TYLENOL) 500 MG tablet   albuterol (PROVENTIL HFA;VENTOLIN HFA) 108 (90 BASE) MCG/ACT inhaler   amLODipine (NORVASC) 5 MG tablet   atorvastatin (LIPITOR) 10 MG tablet   Carboxymeth-Glyc-Polysorb PF (REFRESH OPTIVE ADVANCED PF) 0.5-1-0.5 % SOLN   cetirizine (ZYRTEC) 10 MG tablet   Cholecalciferol (VITAMIN D-3 PO)   cyanocobalamin (VITAMIN B12) 1000 MCG tablet   docusate sodium (COLACE) 100 MG capsule   FEROSUL 325 (65 Fe) MG tablet   folic acid (FOLVITE) 1 MG tablet    HYDROcodone-acetaminophen (NORCO/VICODIN) 5-325 MG tablet   levothyroxine (SYNTHROID) 50 MCG tablet   lisinopril (ZESTRIL) 20 MG tablet   metoprolol succinate (TOPROL-XL) 50 MG 24 hr tablet   Omega-3 Fatty Acids (FISH OIL PO)   omeprazole (PRILOSEC) 20 MG capsule   polyethylene glycol powder (GLYCOLAX/MIRALAX) 17 GM/SCOOP powder   tamsulosin (FLOMAX) 0.4 MG CAPS capsule   theophylline (UNIPHYL) 400 MG 24 hr tablet   No current facility-administered medications for this encounter.     JKonrad FelixWard, PA-C [facility] Pre-Surgical Testing ([phone]

## 2022-05-07 NOTE — Anesthesia Preprocedure Evaluation (Addendum)
Anesthesia Evaluation  Patient identified by MRN, date of birth, ID band Patient awake    Reviewed: Allergy & Precautions, NPO status , Patient's Chart, lab work & pertinent test results  Airway Mallampati: II  TM Distance: >3 FB Neck ROM: Full    Dental no notable dental hx. (+) Teeth Intact, Dental Advisory Given   Pulmonary COPD   Pulmonary exam normal breath sounds clear to auscultation       Cardiovascular hypertension, + CAD  Normal cardiovascular exam Rhythm:Regular Rate:Normal     Neuro/Psych  PSYCHIATRIC DISORDERS         GI/Hepatic ,GERD  Medicated and Controlled,,  Endo/Other    Renal/GU Renal diseaseLab Results      Component                Value               Date                      CREATININE               1.12                05/05/2022                BUN                      15                  05/05/2022                NA                       139                 05/05/2022                K                        3.9                 05/05/2022                 Prostate CA    Musculoskeletal  (+) Arthritis ,    Abdominal   Peds  Hematology Lab Results      Component                Value               Date                      WBC                      3.6 (L)             05/05/2022                HGB                      12.2 (L)            05/05/2022                HCT                      35.3 (L)  05/05/2022                MCV                      93.4                05/05/2022                PLT                      216                 05/05/2022              Anesthesia Other Findings   Reproductive/Obstetrics                             Anesthesia Physical Anesthesia Plan  ASA: 3  Anesthesia Plan: General   Post-op Pain Management: Regional block* and Minimal or no pain anticipated   Induction: Intravenous  PONV Risk Score and Plan: 4 or greater  and Treatment may vary due to age or medical condition, Midazolam, Ondansetron and Dexamethasone  Airway Management Planned: Oral ETT  Additional Equipment: None  Intra-op Plan:   Post-operative Plan: Extubation in OR  Informed Consent: I have reviewed the patients History and Physical, chart, labs and discussed the procedure including the risks, benefits and alternatives for the proposed anesthesia with the patient or authorized representative who has indicated his/her understanding and acceptance.     Dental advisory given  Plan Discussed with: CRNA, Anesthesiologist and Surgeon  Anesthesia Plan Comments: (See PAT note 05/05/2022  GA + L ISB w exparel)       Anesthesia Quick Evaluation

## 2022-05-15 ENCOUNTER — Other Ambulatory Visit: Payer: Self-pay

## 2022-05-15 ENCOUNTER — Encounter (HOSPITAL_COMMUNITY)
Admission: RE | Disposition: A | Discharge status: Home / Self Care | Payer: Self-pay | Source: Home / Self Care | Attending: Orthopedic Surgery

## 2022-05-15 ENCOUNTER — Ambulatory Visit (HOSPITAL_COMMUNITY): Payer: Medicare Other | Admitting: Physician Assistant

## 2022-05-15 ENCOUNTER — Encounter (HOSPITAL_COMMUNITY): Payer: Self-pay | Admitting: Orthopedic Surgery

## 2022-05-15 ENCOUNTER — Ambulatory Visit (HOSPITAL_BASED_OUTPATIENT_CLINIC_OR_DEPARTMENT_OTHER): Payer: Medicare Other | Admitting: Anesthesiology

## 2022-05-15 ENCOUNTER — Ambulatory Visit (HOSPITAL_COMMUNITY)
Admission: RE | Admit: 2022-05-15 | Discharge: 2022-05-15 | Disposition: A | Discharge status: Home / Self Care | Payer: Medicare Other | Attending: Orthopedic Surgery | Admitting: Orthopedic Surgery

## 2022-05-15 DIAGNOSIS — Z8546 Personal history of malignant neoplasm of prostate: Secondary | ICD-10-CM | POA: Diagnosis not present

## 2022-05-15 DIAGNOSIS — Z79890 Hormone replacement therapy (postmenopausal): Secondary | ICD-10-CM | POA: Diagnosis not present

## 2022-05-15 DIAGNOSIS — M75102 Unspecified rotator cuff tear or rupture of left shoulder, not specified as traumatic: Secondary | ICD-10-CM | POA: Diagnosis not present

## 2022-05-15 DIAGNOSIS — J449 Chronic obstructive pulmonary disease, unspecified: Secondary | ICD-10-CM | POA: Diagnosis not present

## 2022-05-15 DIAGNOSIS — I10 Essential (primary) hypertension: Secondary | ICD-10-CM | POA: Diagnosis not present

## 2022-05-15 DIAGNOSIS — I2510 Atherosclerotic heart disease of native coronary artery without angina pectoris: Secondary | ICD-10-CM | POA: Insufficient documentation

## 2022-05-15 DIAGNOSIS — Z79899 Other long term (current) drug therapy: Secondary | ICD-10-CM | POA: Diagnosis not present

## 2022-05-15 DIAGNOSIS — M12812 Other specific arthropathies, not elsewhere classified, left shoulder: Secondary | ICD-10-CM

## 2022-05-15 DIAGNOSIS — M1990 Unspecified osteoarthritis, unspecified site: Secondary | ICD-10-CM | POA: Diagnosis not present

## 2022-05-15 DIAGNOSIS — G8918 Other acute postprocedural pain: Secondary | ICD-10-CM | POA: Diagnosis not present

## 2022-05-15 DIAGNOSIS — K219 Gastro-esophageal reflux disease without esophagitis: Secondary | ICD-10-CM | POA: Insufficient documentation

## 2022-05-15 DIAGNOSIS — Z951 Presence of aortocoronary bypass graft: Secondary | ICD-10-CM | POA: Diagnosis not present

## 2022-05-15 DIAGNOSIS — M19012 Primary osteoarthritis, left shoulder: Secondary | ICD-10-CM | POA: Diagnosis not present

## 2022-05-15 HISTORY — PX: REVERSE SHOULDER ARTHROPLASTY: SHX5054

## 2022-05-15 SURGERY — ARTHROPLASTY, SHOULDER, TOTAL, REVERSE
Anesthesia: General | Site: Shoulder | Laterality: Left

## 2022-05-15 MED ORDER — ONDANSETRON HCL 4 MG PO TABS: 4.0000 mg | ORAL_TABLET | Freq: Three times a day (TID) | ORAL | 0 refills | Status: AC | PRN

## 2022-05-15 MED ORDER — LIDOCAINE HCL (PF) 2 % IJ SOLN
INTRAMUSCULAR | Status: AC
  Filled 2022-05-15: qty 5

## 2022-05-15 MED ORDER — TRANEXAMIC ACID 1000 MG/10ML IV SOLN: 1000.0000 mg | INTRAVENOUS | Status: DC

## 2022-05-15 MED ORDER — 0.9 % SODIUM CHLORIDE (POUR BTL) OPTIME
TOPICAL | Status: DC | PRN
  Administered 2022-05-15: 1000 mL

## 2022-05-15 MED ORDER — PROPOFOL 10 MG/ML IV BOLUS
INTRAVENOUS | Status: AC
  Filled 2022-05-15: qty 20

## 2022-05-15 MED ORDER — LACTATED RINGERS IV BOLUS
500.0000 mL | Freq: Once | INTRAVENOUS | Status: AC
  Administered 2022-05-15: 500 mL via INTRAVENOUS

## 2022-05-15 MED ORDER — PHENYLEPHRINE HCL-NACL 20-0.9 MG/250ML-% IV SOLN
INTRAVENOUS | Status: DC | PRN
  Administered 2022-05-15: 50 ug/min via INTRAVENOUS

## 2022-05-15 MED ORDER — VANCOMYCIN HCL 1000 MG IV SOLR
INTRAVENOUS | Status: AC
  Filled 2022-05-15: qty 20

## 2022-05-15 MED ORDER — PHENYLEPHRINE 80 MCG/ML (10ML) SYRINGE FOR IV PUSH (FOR BLOOD PRESSURE SUPPORT)
PREFILLED_SYRINGE | INTRAVENOUS | Status: DC | PRN
  Administered 2022-05-15 (×2): 80 ug via INTRAVENOUS
  Administered 2022-05-15: 160 ug via INTRAVENOUS

## 2022-05-15 MED ORDER — ONDANSETRON HCL 4 MG/2ML IJ SOLN
INTRAMUSCULAR | Status: DC | PRN
  Administered 2022-05-15: 4 mg via INTRAVENOUS

## 2022-05-15 MED ORDER — ONDANSETRON HCL 4 MG/2ML IJ SOLN
INTRAMUSCULAR | Status: AC
  Filled 2022-05-15: qty 2

## 2022-05-15 MED ORDER — VANCOMYCIN HCL 1000 MG IV SOLR
INTRAVENOUS | Status: DC | PRN
  Administered 2022-05-15: 1000 mg via TOPICAL

## 2022-05-15 MED ORDER — ONDANSETRON HCL 4 MG PO TABS: 4.0000 mg | ORAL_TABLET | Freq: Four times a day (QID) | ORAL | Status: DC | PRN

## 2022-05-15 MED ORDER — TRANEXAMIC ACID-NACL 1000-0.7 MG/100ML-% IV SOLN
1000.0000 mg | INTRAVENOUS | Status: AC
  Administered 2022-05-15: 1000 mg via INTRAVENOUS
  Filled 2022-05-15: qty 100

## 2022-05-15 MED ORDER — METOCLOPRAMIDE HCL 5 MG/ML IJ SOLN: 5.0000 mg | Freq: Three times a day (TID) | INTRAMUSCULAR | Status: DC | PRN

## 2022-05-15 MED ORDER — EPHEDRINE 5 MG/ML INJ
INTRAVENOUS | Status: AC
  Filled 2022-05-15: qty 5

## 2022-05-15 MED ORDER — LIDOCAINE HCL (CARDIAC) PF 100 MG/5ML IV SOSY
PREFILLED_SYRINGE | INTRAVENOUS | Status: DC | PRN
  Administered 2022-05-15: 80 mg via INTRAVENOUS

## 2022-05-15 MED ORDER — HYDROCODONE-ACETAMINOPHEN 5-325 MG PO TABS: 1.0000 | ORAL_TABLET | Freq: Four times a day (QID) | ORAL | 0 refills | Status: AC | PRN

## 2022-05-15 MED ORDER — ROCURONIUM BROMIDE 10 MG/ML (PF) SYRINGE
PREFILLED_SYRINGE | INTRAVENOUS | Status: AC
  Filled 2022-05-15: qty 10

## 2022-05-15 MED ORDER — BUPIVACAINE HCL (PF) 0.5 % IJ SOLN
INTRAMUSCULAR | Status: DC | PRN
  Administered 2022-05-15: 10 mL via PERINEURAL

## 2022-05-15 MED ORDER — CEFAZOLIN SODIUM-DEXTROSE 2-4 GM/100ML-% IV SOLN
2.0000 g | INTRAVENOUS | Status: AC
  Administered 2022-05-15: 2 g via INTRAVENOUS
  Filled 2022-05-15: qty 100

## 2022-05-15 MED ORDER — PROPOFOL 10 MG/ML IV BOLUS
INTRAVENOUS | Status: DC | PRN
  Administered 2022-05-15: 100 mg via INTRAVENOUS

## 2022-05-15 MED ORDER — SUGAMMADEX SODIUM 200 MG/2ML IV SOLN
INTRAVENOUS | Status: DC | PRN
  Administered 2022-05-15: 160 mg via INTRAVENOUS

## 2022-05-15 MED ORDER — LACTATED RINGERS IV SOLN: INTRAVENOUS | Status: DC

## 2022-05-15 MED ORDER — ROCURONIUM BROMIDE 10 MG/ML (PF) SYRINGE
PREFILLED_SYRINGE | INTRAVENOUS | Status: DC | PRN
  Administered 2022-05-15: 50 mg via INTRAVENOUS

## 2022-05-15 MED ORDER — FENTANYL CITRATE PF 50 MCG/ML IJ SOSY
50.0000 ug | PREFILLED_SYRINGE | Freq: Once | INTRAMUSCULAR | Status: AC
  Administered 2022-05-15: 25 ug via INTRAVENOUS
  Filled 2022-05-15: qty 2

## 2022-05-15 MED ORDER — LACTATED RINGERS IV BOLUS: 250.0000 mL | Freq: Once | INTRAVENOUS | Status: DC

## 2022-05-15 MED ORDER — STERILE WATER FOR IRRIGATION IR SOLN
Status: DC | PRN
  Administered 2022-05-15: 2000 mL

## 2022-05-15 MED ORDER — ORAL CARE MOUTH RINSE: 15.0000 mL | Freq: Once | OROMUCOSAL | Status: AC

## 2022-05-15 MED ORDER — DEXAMETHASONE SODIUM PHOSPHATE 10 MG/ML IJ SOLN
INTRAMUSCULAR | Status: DC | PRN
  Administered 2022-05-15: 5 mg via INTRAVENOUS

## 2022-05-15 MED ORDER — EPHEDRINE SULFATE-NACL 50-0.9 MG/10ML-% IV SOSY
PREFILLED_SYRINGE | INTRAVENOUS | Status: DC | PRN
  Administered 2022-05-15: 10 mg via INTRAVENOUS
  Administered 2022-05-15: 5 mg via INTRAVENOUS
  Administered 2022-05-15: 10 mg via INTRAVENOUS

## 2022-05-15 MED ORDER — PHENYLEPHRINE 80 MCG/ML (10ML) SYRINGE FOR IV PUSH (FOR BLOOD PRESSURE SUPPORT)
PREFILLED_SYRINGE | INTRAVENOUS | Status: AC
  Filled 2022-05-15: qty 10

## 2022-05-15 MED ORDER — CHLORHEXIDINE GLUCONATE 0.12 % MT SOLN
15.0000 mL | Freq: Once | OROMUCOSAL | Status: AC
  Administered 2022-05-15: 15 mL via OROMUCOSAL

## 2022-05-15 MED ORDER — METOCLOPRAMIDE HCL 5 MG PO TABS: 5.0000 mg | ORAL_TABLET | Freq: Three times a day (TID) | ORAL | Status: DC | PRN

## 2022-05-15 MED ORDER — BUPIVACAINE LIPOSOME 1.3 % IJ SUSP
INTRAMUSCULAR | Status: DC | PRN
  Administered 2022-05-15: 10 mL via PERINEURAL

## 2022-05-15 MED ORDER — ONDANSETRON HCL 4 MG/2ML IJ SOLN: 4.0000 mg | Freq: Four times a day (QID) | INTRAMUSCULAR | Status: DC | PRN

## 2022-05-15 SURGICAL SUPPLY — 63 items
BAG COUNTER SPONGE SURGICOUNT (BAG) IMPLANT
BAG ZIPLOCK 12X15 (MISCELLANEOUS) ×1 IMPLANT
BLADE SAW SGTL 83.5X18.5 (BLADE) ×1 IMPLANT
BNDG COHESIVE 4X5 TAN STRL LF (GAUZE/BANDAGES/DRESSINGS) ×1 IMPLANT
COOLER ICEMAN CLASSIC (MISCELLANEOUS) ×1 IMPLANT
COVER BACK TABLE 60X90IN (DRAPES) ×1 IMPLANT
COVER SURGICAL LIGHT HANDLE (MISCELLANEOUS) ×1 IMPLANT
CUP SUT UNIV REVERS 39 NEU (Shoulder) IMPLANT
DERMABOND ADVANCED .7 DNX12 (GAUZE/BANDAGES/DRESSINGS) ×1 IMPLANT
DRAPE ORTHO SPLIT 77X108 STRL (DRAPES) ×2
DRAPE SHEET LG 3/4 BI-LAMINATE (DRAPES) ×1 IMPLANT
DRAPE SURG 17X11 SM STRL (DRAPES) ×1 IMPLANT
DRAPE SURG ORHT 6 SPLT 77X108 (DRAPES) ×2 IMPLANT
DRAPE TOP 10253 STERILE (DRAPES) ×1 IMPLANT
DRAPE U-SHAPE 47X51 STRL (DRAPES) ×1 IMPLANT
DRESSING AQUACEL AG SP 3.5X6 (GAUZE/BANDAGES/DRESSINGS) ×1 IMPLANT
DRSG AQUACEL AG ADV 3.5X 6 (GAUZE/BANDAGES/DRESSINGS) IMPLANT
DRSG AQUACEL AG ADV 3.5X10 (GAUZE/BANDAGES/DRESSINGS) IMPLANT
DRSG AQUACEL AG SP 3.5X6 (GAUZE/BANDAGES/DRESSINGS) ×1
DURAPREP 26ML APPLICATOR (WOUND CARE) ×1 IMPLANT
ELECT BLADE TIP CTD 4 INCH (ELECTRODE) ×1 IMPLANT
ELECT PENCIL ROCKER SW 15FT (MISCELLANEOUS) ×1 IMPLANT
ELECT REM PT RETURN 15FT ADLT (MISCELLANEOUS) ×1 IMPLANT
FACESHIELD WRAPAROUND (MASK) ×5 IMPLANT
FACESHIELD WRAPAROUND OR TEAM (MASK) ×5 IMPLANT
GLENOID UNI REV MOD 24 +2 LAT (Joint) IMPLANT
GLENOSPHERE 39+4 LAT/24 UNI RV (Joint) IMPLANT
GLOVE BIO SURGEON STRL SZ7.5 (GLOVE) ×1 IMPLANT
GLOVE BIO SURGEON STRL SZ8 (GLOVE) ×1 IMPLANT
GLOVE SS BIOGEL STRL SZ 7 (GLOVE) ×1 IMPLANT
GLOVE SS BIOGEL STRL SZ 7.5 (GLOVE) ×1 IMPLANT
GOWN STRL SURGICAL XL XLNG (GOWN DISPOSABLE) ×2 IMPLANT
INSERT HUM REV 39 +6 (Insert) IMPLANT
KIT BASIN OR (CUSTOM PROCEDURE TRAY) ×1 IMPLANT
KIT TURNOVER KIT A (KITS) IMPLANT
MANIFOLD NEPTUNE II (INSTRUMENTS) ×1 IMPLANT
NDL TAPERED W/ NITINOL LOOP (MISCELLANEOUS) ×1 IMPLANT
NEEDLE TAPERED W/ NITINOL LOOP (MISCELLANEOUS) ×1 IMPLANT
NS IRRIG 1000ML POUR BTL (IV SOLUTION) ×1 IMPLANT
PACK SHOULDER (CUSTOM PROCEDURE TRAY) ×1 IMPLANT
PAD ARMBOARD 7.5X6 YLW CONV (MISCELLANEOUS) ×1 IMPLANT
PAD COLD SHLDR WRAP-ON (PAD) ×1 IMPLANT
PIN NITINOL TARGETER 2.8 (PIN) IMPLANT
PIN SET MODULAR GLENOID SYSTEM (PIN) IMPLANT
RESTRAINT HEAD UNIVERSAL NS (MISCELLANEOUS) ×1 IMPLANT
SCREW CENTRAL MOD 35 (Screw) IMPLANT
SCREW LOCK PERI 5.5X44 (Screw) IMPLANT
SCREW PERI LOCK 5.5X16 (Screw) IMPLANT
SCREW PERIPHERAL 5.5X20 LOCK (Screw) IMPLANT
SCREW PERIPHERAL 5.5X40 LOCK (Screw) IMPLANT
SLING ARM FOAM STRAP LRG (SOFTGOODS) IMPLANT
SLING ARM FOAM STRAP MED (SOFTGOODS) IMPLANT
STEM HUMERAL UNIVERS SZ8 (Stem) IMPLANT
SUT MNCRL AB 3-0 PS2 18 (SUTURE) ×1 IMPLANT
SUT MON AB 2-0 CT1 36 (SUTURE) ×1 IMPLANT
SUT VIC AB 1 CT1 36 (SUTURE) ×1 IMPLANT
SUTURE TAPE 1.3 40 TPR END (SUTURE) ×2 IMPLANT
SUTURETAPE 1.3 40 TPR END (SUTURE) ×2
TOWEL OR 17X26 10 PK STRL BLUE (TOWEL DISPOSABLE) ×1 IMPLANT
TOWEL OR NON WOVEN STRL DISP B (DISPOSABLE) ×1 IMPLANT
TUBE SUCTION HIGH CAP CLEAR NV (SUCTIONS) ×1 IMPLANT
TUBING CONNECTING 10 (TUBING) IMPLANT
WATER STERILE IRR 1000ML POUR (IV SOLUTION) ×2 IMPLANT

## 2022-05-15 NOTE — Anesthesia Procedure Notes (Signed)
Anesthesia Regional Block: Interscalene brachial plexus block   Pre-Anesthetic Checklist: , timeout performed,  Correct Patient, Correct Site, Correct Laterality,  Correct Procedure, Correct Position, site marked,  Risks and benefits discussed,  Surgical consent,  Pre-op evaluation,  At surgeon's request and post-op pain management  Laterality: Upper and Left  Prep: Maximum Sterile Barrier Precautions used, chloraprep       Needles:  Injection technique: Single-shot  Needle Type: Echogenic Needle     Needle Length: 5cm  Needle Gauge: 21     Additional Needles:   Procedures:,,,, ultrasound used (permanent image in chart),,    Narrative:  Start time: 05/15/2022 9:02 AM End time: 05/15/2022 9:05 AM Injection made incrementally with aspirations every 5 mL.  Performed by: Personally  Anesthesiologist: Barnet Glasgow, MD  Additional Notes: Block assessed prior to procedure. Patient tolerated procedure well.

## 2022-05-15 NOTE — Anesthesia Procedure Notes (Addendum)
Procedure Name: Intubation Date/Time: 05/15/2022 9:41 AM  Performed by: Raenette Rover, CRNAPre-anesthesia Checklist: Patient identified, Emergency Drugs available, Suction available and Patient being monitored Patient Re-evaluated:Patient Re-evaluated prior to induction Oxygen Delivery Method: Circle system utilized Preoxygenation: Pre-oxygenation with 100% oxygen Induction Type: IV induction Ventilation: Mask ventilation without difficulty Laryngoscope Size: Mac and 4 Grade View: Grade I Tube type: Oral Tube size: 7.5 mm Number of attempts: 1 Airway Equipment and Method: Stylet Placement Confirmation: ETT inserted through vocal cords under direct vision, positive ETCO2 and breath sounds checked- equal and bilateral Secured at: 21 cm Tube secured with: Tape Dental Injury: Teeth and Oropharynx as per pre-operative assessment

## 2022-05-15 NOTE — Evaluation (Signed)
Occupational Therapy Evaluation Patient Details Name: Gabriel Mcdonald MRN: [id number] DOB: [birth year] Today's Date: 05/15/2022   History of Present Illness 55 uear old man s/p left reverse shoulder arthroplasty.   Clinical Impression   Gabriel Mcdonald presents s/p shoulder replacement without functional use of right dominant upper extremity secondary to effects of surgery and interscalene block and shoulder precautions. Therapist provided education and instruction to patient and spouse in regards to exercises, precautions, positioning, donning upper extremity clothing and bathing while maintaining shoulder precautions, ice and edema management and donning/doffing sling. Patient and spouse verbalized understanding and demonstrated as needed. Patient's wife educated on use of ice cooler and cuff. Patient needed assistance to donn shirt, underwear, pants, socks and shoes and provided with instruction on compensatory strategies to perform ADLs. Handouts provided to maximize retention of education. Patient to follow up with MD for further therapy needs.        Recommendations for follow up therapy are one component of a multi-disciplinary discharge planning process, led by the attending physician.  Recommendations may be updated based on patient status, additional functional criteria and insurance authorization.   Follow Up Recommendations  Follow physician's recommendations for discharge plan and follow up therapies     Assistance Recommended at Discharge Frequent or constant Supervision/Assistance  Patient can return home with the following A little help with walking and/or transfers;A lot of help with bathing/dressing/bathroom;Assistance with cooking/housework    Functional Status Assessment  Patient has had a recent decline in their functional status and demonstrates the ability to make significant improvements in function in a reasonable and predictable amount of time.  Equipment  Recommendations  None recommended by OT    Recommendations for Other Services       Precautions / Restrictions Precautions Precautions: Shoulder Type of Shoulder Precautions: If sitting in controlled environment, ok to come out of sling to give neck a break. Please sleep in it to protect until follow up in office.     OK to use operative arm for feeding, hygiene and ADLs.   Ok to instruct Pendulums and lap slides as exercises. Ok to use operative arm within the following parameters for ADL purposes     New ROM (8/18)   Ok for PROM, AAROM, AROM within pain tolerance and within the following ROM   ER 20   ABD 45   FE 60 Shoulder Interventions: Shoulder sling/immobilizer;Off for dressing/bathing/exercises Precaution Booklet Issued:  (handouts) Required Braces or Orthoses: Sling Restrictions Weight Bearing Restrictions: Yes LUE Weight Bearing: Non weight bearing      Mobility Bed Mobility                    Transfers                          Balance Overall balance assessment: Mild deficits observed, not formally tested                                         ADL either performed or assessed with clinical judgement   ADL Overall ADL's : Needs assistance/impaired Eating/Feeding: Set up   Grooming: Set up   Upper Body Bathing: Moderate assistance   Lower Body Bathing: Moderate assistance   Upper Body Dressing : Maximal assistance   Lower Body Dressing: Maximal assistance   Toilet Transfer: Minimal assistance   Toileting-  Clothing Manipulation and Hygiene: Maximal assistance;Sit to/from stand       Functional mobility during ADLs: Minimal assistance General ADL Comments: Min assist for standing for steadying     Vision Baseline Vision/History: 1 Wears glasses       Perception     Praxis      Pertinent Vitals/Pain Pain Assessment Pain Assessment: No/denies pain     Hand Dominance Right   Extremity/Trunk Assessment Upper  Extremity Assessment Upper Extremity Assessment: LUE deficits/detail LUE Deficits / Details: impaired motor control and sensation secondary to block   Lower Extremity Assessment Lower Extremity Assessment: Overall WFL for tasks assessed   Cervical / Trunk Assessment Cervical / Trunk Assessment: Normal   Communication Communication Communication: HOH   Cognition Arousal/Alertness: Suspect due to medications Behavior During Therapy: WFL for tasks assessed/performed Overall Cognitive Status: Within Functional Limits for tasks assessed                                       General Comments       Exercises     Shoulder Instructions Shoulder Instructions Donning/doffing shirt without moving shoulder: Caregiver independent with task Method for sponge bathing under operated UE: Caregiver independent with task Donning/doffing sling/immobilizer: Caregiver independent with task Correct positioning of sling/immobilizer: Caregiver independent with task Pendulum exercises (written home exercise program): Caregiver independent with task ROM for elbow, wrist and digits of operated UE: Caregiver independent with task Sling wearing schedule (on at all times/off for ADL's): Caregiver independent with task Proper positioning of operated UE when showering: Caregiver independent with task Dressing change: Caregiver independent with task Positioning of UE while sleeping: Caregiver independent with task    Home Living Family/patient expects to be discharged to:: Private residence Living Arrangements: Spouse/significant other Available Help at Discharge: Family;Available 24 hours/day Type of Home: House                 Bathroom Toilet: Handicapped height                Prior Functioning/Environment Prior Level of Function : Independent/Modified Independent                        OT Problem List: Decreased strength;Decreased range of motion;Impaired UE  functional use;Pain      OT Treatment/Interventions:      OT Goals(Current goals can be found in the care plan section) Acute Rehab OT Goals OT Goal Formulation: All assessment and education complete, DC therapy  OT Frequency:      Co-evaluation              AM-PAC OT "6 Clicks" Daily Activity     Outcome Measure Help from another person eating meals?: A Little Help from another person taking care of personal grooming?: A Little Help from another person toileting, which includes using toliet, bedpan, or urinal?: A Lot Help from another person bathing (including washing, rinsing, drying)?: A Lot Help from another person to put on and taking off regular upper body clothing?: A Lot Help from another person to put on and taking off regular lower body clothing?: A Lot 6 Click Score: 14   End of Session Nurse Communication:  (OT educaton complete)  Activity Tolerance: Patient tolerated treatment well Patient left: in chair;with call bell/phone within reach;with family/visitor present  OT Visit Diagnosis: Pain  Time: 6144-3154 OT Time Calculation (min): 25 min Charges:  OT General Charges $OT Visit: 1 Visit OT Evaluation $OT Eval Low Complexity: 1 Low OT Treatments $Self Care/Home Management : 8-22 mins  Gustavo Lah, OTR/L [place]  Office [phone]   Lenward Chancellor 05/15/2022, 1:31 PM

## 2022-05-15 NOTE — Transfer of Care (Signed)
Immediate Anesthesia Transfer of Care Note  Patient: Gabriel Mcdonald  Procedure(s) Performed: REVERSE SHOULDER ARTHROPLASTY (Left: Shoulder)  Patient Location: PACU  Anesthesia Type:Regional and GA combined with regional for post-op pain  Level of Consciousness: drowsy  Airway & Oxygen Therapy: Patient Spontanous Breathing and Patient connected to face mask oxygen  Post-op Assessment: Report given to RN and Post -op Vital signs reviewed and stable  Post vital signs: Reviewed and stable  Last Vitals:  Vitals Value Taken Time  BP 147/49 05/15/22 1115  Temp    Pulse 60 05/15/22 1117  Resp 17 05/15/22 1117  SpO2 100 % 05/15/22 1117  Vitals shown include unvalidated device data.  Last Pain:  Vitals:   05/15/22 0903  TempSrc:   PainSc: 0-No pain         Complications: No notable events documented.

## 2022-05-15 NOTE — Op Note (Signed)
05/15/2022  11:03 AM  PATIENT:   Gabriel Mcdonald  88 y.o. male  PRE-OPERATIVE DIAGNOSIS:  Left shoulder rotator cuff tear arthropathy  POST-OPERATIVE DIAGNOSIS: Same  PROCEDURE: Left shoulder reverse arthroplasty utilizing a press-fit size 8 Arthrex stem with a neutral metaphysis, +6 constrained polyethylene insert, 39/+4 glenosphere and a small/+2 baseplate  SURGEON:  Pierrette Scheu, Metta Clines M.D.  ASSISTANTS: Jenetta Loges, PA-C  Jenetta Loges, PA-C was utilized as an Environmental consultant throughout this case, essential for help with positioning the patient, positioning extremity, tissue manipulation, implantation of the prosthesis, suture management, wound closure, and intraoperative decision-making.  ANESTHESIA:   General endotracheal and interscalene block with Exparel  EBL: Approximate 100 cc  SPECIMEN: None  Drains: None   PATIENT DISPOSITION:  PACU - hemodynamically stable.    PLAN OF CARE: Discharge to home after PACU  Brief history:  Patient is an 88 year old male with a long history of progressive increasing left shoulder pain which has now progressed to the point that it significantly impacting his quality of life and ability to perform activities of daily living.  Due to his failure to respond to prolonged attempts of conservative management he is brought to the operating room this time for planned left shoulder reverse arthroplasty.  Preoperatively, I counseled the patient regarding treatment options and risks versus benefits thereof.  Possible surgical complications were all reviewed including potential for bleeding, infection, neurovascular injury, persistent pain, loss of motion, anesthetic complication, failure of the implant, and possible need for additional surgery. They understand and accept and agrees with our planned procedure.   Procedure in detail:  After undergoing routine preop evaluation the patient received prophylactic antibiotics and interscalene block with Exparel  was established in the holding area by the anesthesia department.  Patient was subsequently placed spine on the operating table and underwent the smooth induction of a general endotracheal anesthesia.  Placed into the beachchair position and appropriately padded and protected.  The left shoulder girdle region was sterilely prepped and draped in standard fashion.  Should mention that at the time of positioning we did notice an area of superficial skin tearing at the dorsal aspect of his wrist which had been present on his admission this morning and this area was carefully cleaned and dressed and sealed with a waterproof dressing and was completely excluded from the surgical field.  Timeout was called.  A deltopectoral approach to the left shoulder was made through an approximately 8 cm incision.  Skin flaps were elevated dissection carried deeply and the deltopectoral interval was then developed from proximal to distal with the vein taken laterally.  The conjoined tendon was mobilized and retracted medially and the adhesions beneath the deltoid were divided.  The long head biceps tendon was tenodesed at the upper border of the pectoralis major tendon and the proximal segment was unroofed and excised.  The remnant of the rotator cuff superiorly was split from the apex of the bicipital groove to the base of the coracoid and the subscapularis was then separated from the lesser tuberosity using electrocautery and the free margin was tagged with a pair of grasping suture tape sutures.  Capsular attachments were then divided from the anterior and inferior margins of the humeral neck and the humeral head was delivered through the wound.  An extra medullary guide was then used to outline the proposed humeral head resection which creep performed with an oscillating saw at approximately 20 degrees retroversion.  A metal cap was then placed over the cut proximal  humeral surface and we exposed glenoid and performed a  circumferential labral resection.  Guidepin directed into the center of the glenoid with a slight 10 degree inferior tilt and glide was then prepared with the central followed by the peripheral reamer to a stable subchondral bony bed no debris was carefully removed.  Preparation completed with a drill and tap for a 35 mm lag screw.  Our baseplate was then assembled and vancomycin powder was applied to the threads of the lag screw and the baseplate was then inserted achieving excellent purchase and fixation.  All of the peripheral locking screws were then placed using standard technique with excellent fixation.  A 39/+4 glenosphere was then impacted onto the baseplate and the central locking screw was placed.  Attention returned to the metaphysis where the canal was opened.  We did retrieve a previously placed suture anchor and suture material.  The humerus and broached up to a size 8 at 20 degrees of retroversion and a neutral metaphyseal reaming guide was then used to repair the metaphysis.  A trial implant was then placed in trial reduction showed excellent motion and stability and soft tissue balance all much to our satisfaction.  The trial implant was then removed.  The final implant was assembled.  The canal was irrigated cleaned and dried and vancomycin powder sprayed into the canal.  The implant was then seated with excellent fixation.  Trial reduction showed excellent motion and stability with the +6 spacer.  Trial was then removed and a final +6 constrained poly was then impacted and final reduction showed excellent motion stability and soft tissue balance.  The joint was copes irrigated.  Final hemostasis was obtained.  We confirmed good elasticity of the subscapularis which was repaired back to the eyelets on the collar the implant using the previously placed suture tape sutures.  The deltopectoral interval was then reapproximated with a series of figure-of-eight number Vicryl sutures.  2-0 Monocryl used  for the subcu layer and intracuticular 3-0 Monocryl used to close the skin followed by Dermabond and Aquacel dressing.  The left arm was placed into a sling and the patient was awakened, extubated, and taken to the recovery room in stable condition.  Marin Shutter MD   Contact # [phone]

## 2022-05-15 NOTE — Discharge Instructions (Signed)

## 2022-05-15 NOTE — Anesthesia Postprocedure Evaluation (Signed)
Anesthesia Post Note  Patient: Gabriel Mcdonald  Procedure(s) Performed: REVERSE SHOULDER ARTHROPLASTY (Left: Shoulder)     Patient location during evaluation: PACU Anesthesia Type: General Level of consciousness: awake and alert Pain management: pain level controlled Vital Signs Assessment: post-procedure vital signs reviewed and stable Respiratory status: spontaneous breathing, nonlabored ventilation, respiratory function stable and patient connected to nasal cannula oxygen Cardiovascular status: blood pressure returned to baseline and stable Postop Assessment: no apparent nausea or vomiting Anesthetic complications: no  No notable events documented.  Last Vitals:  Vitals:   05/15/22 1215 05/15/22 1221  BP: (!) 140/56 (!) 139/55  Pulse: (!) 50 (!) 50  Resp: 20   Temp:  36.6 C  SpO2: 100% 100%    Last Pain:  Vitals:   05/15/22 1221  TempSrc:   PainSc: 0-No pain                 Barnet Glasgow

## 2022-05-15 NOTE — H&P (Signed)
Gabriel Mcdonald    Chief Complaint: Left shoulder rotator cuff tear arthropathy HPI: The patient is a 88 y.o. male with chronic and progressive increasing left shoulder pain related to severe rotator cuff tear arthropathy.  Due to his increasing functional limitations and failure to respond to prolonged attempts at conservative management, he is brought to the operating room at this time for planned left shoulder reverse arthroplasty.  Past Medical History:  Diagnosis Date   Anemia    Arthritis    Cancer ([place])    prostate   Cataract    COPD (chronic obstructive pulmonary disease) (HCC)    Pleural thickening per pt, pt reports exposure to asbestos   Coronary artery disease    GERD (gastroesophageal reflux disease)    mild   High cholesterol    History of kidney stones    Hypertension    Shortness of breath       Past Surgical History:  Procedure Laterality Date   CATARACT EXTRACTION W/PHACO  04/29/2012   Procedure: CATARACT EXTRACTION PHACO AND INTRAOCULAR LENS PLACEMENT ([place]);  Surgeon: Gabriel Branch, MD;  Location: [facility];  Service: Ophthalmology;  Laterality: Right;  CDE:10.09   CATARACT EXTRACTION W/PHACO  06/07/2012   Procedure: CATARACT EXTRACTION PHACO AND INTRAOCULAR LENS PLACEMENT ([facility]);  Surgeon: Gabriel Branch, MD;  Location: [facility];  Service: Ophthalmology;  Laterality: Left;  CDE:16.19   CORONARY ANGIOPLASTY  2011   stents   CORONARY ARTERY BYPASS GRAFT  [facility]   EYE SURGERY     LUMBAR LAMINECTOMY/DECOMPRESSION MICRODISCECTOMY Right 05/03/2013   Procedure: RIGHT LUMBAR DISCECTOMY LUMBAR FOUR-FIVE;  Surgeon: Gabriel Stakes, MD;  Location: [place] [facility];  Service: Neurosurgery;  Laterality: Right;  right    Pylonodial cyst removal     from tip of spine   TONSILLECTOMY     TOTAL KNEE ARTHROPLASTY Left 08/27/2020   Procedure: TOTAL KNEE ARTHROPLASTY;  Surgeon: Gabriel Arabian, MD;  Location: [facility];  Service: Orthopedics;  Laterality: Left;  25mn    Family  History  Problem Relation Age of Onset   Colon cancer Neg Hx    Stomach cancer Neg Hx    Pancreatic cancer Neg Hx    Esophageal cancer Neg Hx    Liver disease Neg Hx     Social History:  reports that he has never smoked. He has never used smokeless tobacco. He reports that he does not drink alcohol and does not use drugs.  BMI: Estimated body mass index is 24.41 kg/m as calculated from the following:   Height as of 05/05/22: 6' (1.829 m).   Weight as of this encounter: 81.6 kg.  Lab Results  Component Value Date   ALBUMIN 3.6 08/29/2020   Diabetes: Patient does not have a diagnosis of diabetes.     Smoking Status:   reports that he has never smoked. He has never used smokeless tobacco.     Medications Prior to Admission  Medication Sig Dispense Refill   acetaminophen (TYLENOL) 500 MG tablet Take 1-2 tablets (500-1,000 mg total) by mouth every 8 (eight) hours as needed for mild pain or moderate pain. 30 tablet 0   albuterol (PROVENTIL HFA;VENTOLIN HFA) 108 (90 BASE) MCG/ACT inhaler Inhale 2 puffs into the lungs every 6 (six) hours as needed for shortness of breath or wheezing.     amLODipine (NORVASC) 5 MG tablet Take 5 mg by mouth every evening.     atorvastatin (LIPITOR) 10 MG tablet Take 10 mg  by mouth every evening.      Carboxymeth-Glyc-Polysorb PF (REFRESH OPTIVE ADVANCED PF) 0.5-1-0.5 % SOLN Place 1-2 drops into both eyes 3 (three) times daily as needed (dry/irritated eyes.).     cetirizine (ZYRTEC) 10 MG tablet Take 10 mg by mouth daily as needed for allergies.     Cholecalciferol (VITAMIN D-3 PO) Take 1 tablet by mouth in the morning and at bedtime.     cyanocobalamin (VITAMIN B12) 1000 MCG tablet Take 1,000 mcg by mouth 3 (three) times a week.     docusate sodium (COLACE) 100 MG capsule Take 100-200 mg by mouth 2 (two) times daily as needed for mild constipation.     FEROSUL 325 (65 Fe) MG tablet Take 325 mg by mouth 2 (two) times daily.     folic acid (FOLVITE) 1  MG tablet Take 1 mg by mouth 3 (three) times a week. At night.     HYDROcodone-acetaminophen (NORCO/VICODIN) 5-325 MG tablet Take 1 tablet by mouth 3 (three) times daily as needed (pain.).     levothyroxine (SYNTHROID) 50 MCG tablet Take 50 mcg by mouth daily before breakfast.     lisinopril (ZESTRIL) 20 MG tablet Take 20 mg by mouth every evening.     metoprolol succinate (TOPROL-XL) 50 MG 24 hr tablet Take 50 mg by mouth every evening. Take with or immediately following a meal.     Omega-3 Fatty Acids (FISH OIL PO) Take 1 capsule by mouth in the morning and at bedtime.     omeprazole (PRILOSEC) 20 MG capsule Take 20 mg by mouth daily before breakfast.     polyethylene glycol powder (GLYCOLAX/MIRALAX) 17 GM/SCOOP powder Take 17 g by mouth daily.     tamsulosin (FLOMAX) 0.4 MG CAPS capsule Take 0.4 mg by mouth daily in the afternoon.     theophylline (UNIPHYL) 400 MG 24 hr tablet Take 400 mg by mouth every morning.       Physical Exam: Left shoulder demonstrates painful and guarded motion as noted at recent office visits.  He is grossly neurovascularly intact.  Profoundly restricted motion with global weakness.  Radiographs  Imaging studies confirm a massive and severely retracted chronic rotator cuff tear with changes consistent with chronic rotator cuff tear arthropathy.  Vitals  Temp:  [98.5 F (36.9 C)] 98.5 F (36.9 C) (12/21 0822) Pulse Rate:  [46] 46 (12/21 0822) Resp:  [18] 18 (12/21 0822) BP: (138)/(77) 138/77 (12/21 0822) SpO2:  [97 %] 97 % (12/21 0822) Weight:  [81.6 kg] 81.6 kg (12/21 0847)  Assessment/Plan  Impression: Left shoulder rotator cuff tear arthropathy  Plan of Action: Procedure(s): REVERSE SHOULDER ARTHROPLASTY  Gabriel Mcdonald 05/15/2022, 8:50 AM Contact # [phone]

## 2022-05-15 NOTE — Care Plan (Signed)
Ortho Bundle Case Management Note  Patient Details  Name: Gabriel Mcdonald MRN: [id number] Date of Birth: 19-Sep-1931                  L Rev TSA on 05/15/22.  DCP: Home with fiance Butch Penny.  DME: Sling and ice machine provided by hospital.  PT: [facility]   DME Arranged:  N/A DME Agency:       Additional Comments: Please contact me with any questions of if this plan should need to change.  Dario Ave, Case Manager  [facility]  [phone] 05/15/2022, 1:14 PM

## 2022-05-21 ENCOUNTER — Encounter (HOSPITAL_COMMUNITY): Payer: Self-pay | Admitting: Orthopedic Surgery

## 2022-05-28 DIAGNOSIS — Z4789 Encounter for other orthopedic aftercare: Secondary | ICD-10-CM | POA: Diagnosis not present

## 2022-06-12 DIAGNOSIS — Z471 Aftercare following joint replacement surgery: Secondary | ICD-10-CM | POA: Diagnosis not present

## 2022-06-12 DIAGNOSIS — M6281 Muscle weakness (generalized): Secondary | ICD-10-CM | POA: Diagnosis not present

## 2022-06-12 DIAGNOSIS — M25512 Pain in left shoulder: Secondary | ICD-10-CM | POA: Diagnosis not present

## 2022-06-16 DIAGNOSIS — Z471 Aftercare following joint replacement surgery: Secondary | ICD-10-CM | POA: Diagnosis not present

## 2022-06-16 DIAGNOSIS — M25512 Pain in left shoulder: Secondary | ICD-10-CM | POA: Diagnosis not present

## 2022-06-16 DIAGNOSIS — M6281 Muscle weakness (generalized): Secondary | ICD-10-CM | POA: Diagnosis not present

## 2022-06-19 DIAGNOSIS — M6281 Muscle weakness (generalized): Secondary | ICD-10-CM | POA: Diagnosis not present

## 2022-06-19 DIAGNOSIS — Z471 Aftercare following joint replacement surgery: Secondary | ICD-10-CM | POA: Diagnosis not present

## 2022-06-19 DIAGNOSIS — M25512 Pain in left shoulder: Secondary | ICD-10-CM | POA: Diagnosis not present

## 2022-06-23 DIAGNOSIS — M25512 Pain in left shoulder: Secondary | ICD-10-CM | POA: Diagnosis not present

## 2022-06-23 DIAGNOSIS — M6281 Muscle weakness (generalized): Secondary | ICD-10-CM | POA: Diagnosis not present

## 2022-06-23 DIAGNOSIS — Z471 Aftercare following joint replacement surgery: Secondary | ICD-10-CM | POA: Diagnosis not present

## 2022-06-26 DIAGNOSIS — M47816 Spondylosis without myelopathy or radiculopathy, lumbar region: Secondary | ICD-10-CM | POA: Diagnosis not present

## 2022-06-26 DIAGNOSIS — M25512 Pain in left shoulder: Secondary | ICD-10-CM | POA: Diagnosis not present

## 2022-06-26 DIAGNOSIS — M6281 Muscle weakness (generalized): Secondary | ICD-10-CM | POA: Diagnosis not present

## 2022-06-26 DIAGNOSIS — M9903 Segmental and somatic dysfunction of lumbar region: Secondary | ICD-10-CM | POA: Diagnosis not present

## 2022-06-26 DIAGNOSIS — S338XXA Sprain of other parts of lumbar spine and pelvis, initial encounter: Secondary | ICD-10-CM | POA: Diagnosis not present

## 2022-06-26 DIAGNOSIS — Z471 Aftercare following joint replacement surgery: Secondary | ICD-10-CM | POA: Diagnosis not present

## 2022-06-30 DIAGNOSIS — S338XXA Sprain of other parts of lumbar spine and pelvis, initial encounter: Secondary | ICD-10-CM | POA: Diagnosis not present

## 2022-06-30 DIAGNOSIS — M9903 Segmental and somatic dysfunction of lumbar region: Secondary | ICD-10-CM | POA: Diagnosis not present

## 2022-06-30 DIAGNOSIS — M47816 Spondylosis without myelopathy or radiculopathy, lumbar region: Secondary | ICD-10-CM | POA: Diagnosis not present

## 2022-07-01 DIAGNOSIS — M25512 Pain in left shoulder: Secondary | ICD-10-CM | POA: Diagnosis not present

## 2022-07-01 DIAGNOSIS — M6281 Muscle weakness (generalized): Secondary | ICD-10-CM | POA: Diagnosis not present

## 2022-07-01 DIAGNOSIS — Z471 Aftercare following joint replacement surgery: Secondary | ICD-10-CM | POA: Diagnosis not present

## 2022-07-03 DIAGNOSIS — M9903 Segmental and somatic dysfunction of lumbar region: Secondary | ICD-10-CM | POA: Diagnosis not present

## 2022-07-03 DIAGNOSIS — M25512 Pain in left shoulder: Secondary | ICD-10-CM | POA: Diagnosis not present

## 2022-07-03 DIAGNOSIS — Z471 Aftercare following joint replacement surgery: Secondary | ICD-10-CM | POA: Diagnosis not present

## 2022-07-03 DIAGNOSIS — S338XXA Sprain of other parts of lumbar spine and pelvis, initial encounter: Secondary | ICD-10-CM | POA: Diagnosis not present

## 2022-07-03 DIAGNOSIS — M6281 Muscle weakness (generalized): Secondary | ICD-10-CM | POA: Diagnosis not present

## 2022-07-03 DIAGNOSIS — M47816 Spondylosis without myelopathy or radiculopathy, lumbar region: Secondary | ICD-10-CM | POA: Diagnosis not present

## 2022-07-07 DIAGNOSIS — S338XXA Sprain of other parts of lumbar spine and pelvis, initial encounter: Secondary | ICD-10-CM | POA: Diagnosis not present

## 2022-07-07 DIAGNOSIS — M47816 Spondylosis without myelopathy or radiculopathy, lumbar region: Secondary | ICD-10-CM | POA: Diagnosis not present

## 2022-07-07 DIAGNOSIS — M9903 Segmental and somatic dysfunction of lumbar region: Secondary | ICD-10-CM | POA: Diagnosis not present

## 2022-07-08 DIAGNOSIS — M6281 Muscle weakness (generalized): Secondary | ICD-10-CM | POA: Diagnosis not present

## 2022-07-08 DIAGNOSIS — M25512 Pain in left shoulder: Secondary | ICD-10-CM | POA: Diagnosis not present

## 2022-07-08 DIAGNOSIS — Z471 Aftercare following joint replacement surgery: Secondary | ICD-10-CM | POA: Diagnosis not present

## 2022-07-09 DIAGNOSIS — M47816 Spondylosis without myelopathy or radiculopathy, lumbar region: Secondary | ICD-10-CM | POA: Diagnosis not present

## 2022-07-09 DIAGNOSIS — S338XXA Sprain of other parts of lumbar spine and pelvis, initial encounter: Secondary | ICD-10-CM | POA: Diagnosis not present

## 2022-07-09 DIAGNOSIS — M9903 Segmental and somatic dysfunction of lumbar region: Secondary | ICD-10-CM | POA: Diagnosis not present

## 2022-07-10 DIAGNOSIS — M6281 Muscle weakness (generalized): Secondary | ICD-10-CM | POA: Diagnosis not present

## 2022-07-10 DIAGNOSIS — M25512 Pain in left shoulder: Secondary | ICD-10-CM | POA: Diagnosis not present

## 2022-07-10 DIAGNOSIS — Z471 Aftercare following joint replacement surgery: Secondary | ICD-10-CM | POA: Diagnosis not present

## 2022-07-15 DIAGNOSIS — Z471 Aftercare following joint replacement surgery: Secondary | ICD-10-CM | POA: Diagnosis not present

## 2022-07-15 DIAGNOSIS — M6281 Muscle weakness (generalized): Secondary | ICD-10-CM | POA: Diagnosis not present

## 2022-07-15 DIAGNOSIS — M25512 Pain in left shoulder: Secondary | ICD-10-CM | POA: Diagnosis not present

## 2022-07-16 DIAGNOSIS — M9903 Segmental and somatic dysfunction of lumbar region: Secondary | ICD-10-CM | POA: Diagnosis not present

## 2022-07-16 DIAGNOSIS — S338XXA Sprain of other parts of lumbar spine and pelvis, initial encounter: Secondary | ICD-10-CM | POA: Diagnosis not present

## 2022-07-16 DIAGNOSIS — M47816 Spondylosis without myelopathy or radiculopathy, lumbar region: Secondary | ICD-10-CM | POA: Diagnosis not present

## 2022-07-17 DIAGNOSIS — M6281 Muscle weakness (generalized): Secondary | ICD-10-CM | POA: Diagnosis not present

## 2022-07-17 DIAGNOSIS — Z471 Aftercare following joint replacement surgery: Secondary | ICD-10-CM | POA: Diagnosis not present

## 2022-07-17 DIAGNOSIS — M25512 Pain in left shoulder: Secondary | ICD-10-CM | POA: Diagnosis not present

## 2022-07-21 DIAGNOSIS — M9903 Segmental and somatic dysfunction of lumbar region: Secondary | ICD-10-CM | POA: Diagnosis not present

## 2022-07-21 DIAGNOSIS — M47816 Spondylosis without myelopathy or radiculopathy, lumbar region: Secondary | ICD-10-CM | POA: Diagnosis not present

## 2022-07-21 DIAGNOSIS — S338XXA Sprain of other parts of lumbar spine and pelvis, initial encounter: Secondary | ICD-10-CM | POA: Diagnosis not present

## 2022-07-22 DIAGNOSIS — Z471 Aftercare following joint replacement surgery: Secondary | ICD-10-CM | POA: Diagnosis not present

## 2022-07-22 DIAGNOSIS — M6281 Muscle weakness (generalized): Secondary | ICD-10-CM | POA: Diagnosis not present

## 2022-07-22 DIAGNOSIS — M25512 Pain in left shoulder: Secondary | ICD-10-CM | POA: Diagnosis not present

## 2022-07-24 DIAGNOSIS — M25512 Pain in left shoulder: Secondary | ICD-10-CM | POA: Diagnosis not present

## 2022-07-24 DIAGNOSIS — M6281 Muscle weakness (generalized): Secondary | ICD-10-CM | POA: Diagnosis not present

## 2022-07-24 DIAGNOSIS — Z471 Aftercare following joint replacement surgery: Secondary | ICD-10-CM | POA: Diagnosis not present

## 2022-07-28 DIAGNOSIS — S338XXA Sprain of other parts of lumbar spine and pelvis, initial encounter: Secondary | ICD-10-CM | POA: Diagnosis not present

## 2022-07-28 DIAGNOSIS — M47816 Spondylosis without myelopathy or radiculopathy, lumbar region: Secondary | ICD-10-CM | POA: Diagnosis not present

## 2022-07-28 DIAGNOSIS — M9903 Segmental and somatic dysfunction of lumbar region: Secondary | ICD-10-CM | POA: Diagnosis not present

## 2022-07-29 DIAGNOSIS — Z471 Aftercare following joint replacement surgery: Secondary | ICD-10-CM | POA: Diagnosis not present

## 2022-07-29 DIAGNOSIS — M25512 Pain in left shoulder: Secondary | ICD-10-CM | POA: Diagnosis not present

## 2022-07-29 DIAGNOSIS — M6281 Muscle weakness (generalized): Secondary | ICD-10-CM | POA: Diagnosis not present

## 2022-07-30 DIAGNOSIS — M9903 Segmental and somatic dysfunction of lumbar region: Secondary | ICD-10-CM | POA: Diagnosis not present

## 2022-07-30 DIAGNOSIS — M47816 Spondylosis without myelopathy or radiculopathy, lumbar region: Secondary | ICD-10-CM | POA: Diagnosis not present

## 2022-07-30 DIAGNOSIS — S338XXA Sprain of other parts of lumbar spine and pelvis, initial encounter: Secondary | ICD-10-CM | POA: Diagnosis not present

## 2022-07-31 DIAGNOSIS — Z471 Aftercare following joint replacement surgery: Secondary | ICD-10-CM | POA: Diagnosis not present

## 2022-07-31 DIAGNOSIS — M6281 Muscle weakness (generalized): Secondary | ICD-10-CM | POA: Diagnosis not present

## 2022-07-31 DIAGNOSIS — M25512 Pain in left shoulder: Secondary | ICD-10-CM | POA: Diagnosis not present

## 2022-08-04 DIAGNOSIS — M5126 Other intervertebral disc displacement, lumbar region: Secondary | ICD-10-CM | POA: Diagnosis not present

## 2022-08-04 DIAGNOSIS — R296 Repeated falls: Secondary | ICD-10-CM | POA: Diagnosis not present

## 2022-08-04 DIAGNOSIS — M47816 Spondylosis without myelopathy or radiculopathy, lumbar region: Secondary | ICD-10-CM | POA: Diagnosis not present

## 2022-08-04 DIAGNOSIS — G894 Chronic pain syndrome: Secondary | ICD-10-CM | POA: Diagnosis not present

## 2022-08-06 DIAGNOSIS — M9903 Segmental and somatic dysfunction of lumbar region: Secondary | ICD-10-CM | POA: Diagnosis not present

## 2022-08-06 DIAGNOSIS — S338XXA Sprain of other parts of lumbar spine and pelvis, initial encounter: Secondary | ICD-10-CM | POA: Diagnosis not present

## 2022-08-06 DIAGNOSIS — M47816 Spondylosis without myelopathy or radiculopathy, lumbar region: Secondary | ICD-10-CM | POA: Diagnosis not present

## 2022-08-08 DIAGNOSIS — M6281 Muscle weakness (generalized): Secondary | ICD-10-CM | POA: Diagnosis not present

## 2022-08-08 DIAGNOSIS — Z471 Aftercare following joint replacement surgery: Secondary | ICD-10-CM | POA: Diagnosis not present

## 2022-08-08 DIAGNOSIS — M25512 Pain in left shoulder: Secondary | ICD-10-CM | POA: Diagnosis not present

## 2022-08-11 DIAGNOSIS — Z96612 Presence of left artificial shoulder joint: Secondary | ICD-10-CM | POA: Diagnosis not present

## 2022-08-11 DIAGNOSIS — Z471 Aftercare following joint replacement surgery: Secondary | ICD-10-CM | POA: Diagnosis not present

## 2022-08-12 DIAGNOSIS — M6281 Muscle weakness (generalized): Secondary | ICD-10-CM | POA: Diagnosis not present

## 2022-08-12 DIAGNOSIS — M25512 Pain in left shoulder: Secondary | ICD-10-CM | POA: Diagnosis not present

## 2022-08-12 DIAGNOSIS — Z471 Aftercare following joint replacement surgery: Secondary | ICD-10-CM | POA: Diagnosis not present

## 2022-08-13 DIAGNOSIS — S338XXA Sprain of other parts of lumbar spine and pelvis, initial encounter: Secondary | ICD-10-CM | POA: Diagnosis not present

## 2022-08-13 DIAGNOSIS — M47816 Spondylosis without myelopathy or radiculopathy, lumbar region: Secondary | ICD-10-CM | POA: Diagnosis not present

## 2022-08-13 DIAGNOSIS — M9903 Segmental and somatic dysfunction of lumbar region: Secondary | ICD-10-CM | POA: Diagnosis not present

## 2022-08-14 DIAGNOSIS — Z471 Aftercare following joint replacement surgery: Secondary | ICD-10-CM | POA: Diagnosis not present

## 2022-08-14 DIAGNOSIS — M6281 Muscle weakness (generalized): Secondary | ICD-10-CM | POA: Diagnosis not present

## 2022-08-14 DIAGNOSIS — M25512 Pain in left shoulder: Secondary | ICD-10-CM | POA: Diagnosis not present

## 2022-08-15 DIAGNOSIS — M47816 Spondylosis without myelopathy or radiculopathy, lumbar region: Secondary | ICD-10-CM | POA: Diagnosis not present

## 2022-08-15 DIAGNOSIS — M47896 Other spondylosis, lumbar region: Secondary | ICD-10-CM | POA: Diagnosis not present

## 2022-08-19 DIAGNOSIS — M6281 Muscle weakness (generalized): Secondary | ICD-10-CM | POA: Diagnosis not present

## 2022-08-19 DIAGNOSIS — Z471 Aftercare following joint replacement surgery: Secondary | ICD-10-CM | POA: Diagnosis not present

## 2022-08-19 DIAGNOSIS — M25512 Pain in left shoulder: Secondary | ICD-10-CM | POA: Diagnosis not present

## 2022-08-21 DIAGNOSIS — S338XXA Sprain of other parts of lumbar spine and pelvis, initial encounter: Secondary | ICD-10-CM | POA: Diagnosis not present

## 2022-08-21 DIAGNOSIS — M9903 Segmental and somatic dysfunction of lumbar region: Secondary | ICD-10-CM | POA: Diagnosis not present

## 2022-08-21 DIAGNOSIS — M47816 Spondylosis without myelopathy or radiculopathy, lumbar region: Secondary | ICD-10-CM | POA: Diagnosis not present

## 2022-08-26 DIAGNOSIS — M6281 Muscle weakness (generalized): Secondary | ICD-10-CM | POA: Diagnosis not present

## 2022-08-26 DIAGNOSIS — M25512 Pain in left shoulder: Secondary | ICD-10-CM | POA: Diagnosis not present

## 2022-08-26 DIAGNOSIS — Z471 Aftercare following joint replacement surgery: Secondary | ICD-10-CM | POA: Diagnosis not present

## 2022-08-28 DIAGNOSIS — M9903 Segmental and somatic dysfunction of lumbar region: Secondary | ICD-10-CM | POA: Diagnosis not present

## 2022-08-28 DIAGNOSIS — M47816 Spondylosis without myelopathy or radiculopathy, lumbar region: Secondary | ICD-10-CM | POA: Diagnosis not present

## 2022-08-28 DIAGNOSIS — S338XXA Sprain of other parts of lumbar spine and pelvis, initial encounter: Secondary | ICD-10-CM | POA: Diagnosis not present

## 2022-09-01 DIAGNOSIS — M9903 Segmental and somatic dysfunction of lumbar region: Secondary | ICD-10-CM | POA: Diagnosis not present

## 2022-09-01 DIAGNOSIS — Z471 Aftercare following joint replacement surgery: Secondary | ICD-10-CM | POA: Diagnosis not present

## 2022-09-01 DIAGNOSIS — M6281 Muscle weakness (generalized): Secondary | ICD-10-CM | POA: Diagnosis not present

## 2022-09-01 DIAGNOSIS — M25512 Pain in left shoulder: Secondary | ICD-10-CM | POA: Diagnosis not present

## 2022-09-01 DIAGNOSIS — S338XXA Sprain of other parts of lumbar spine and pelvis, initial encounter: Secondary | ICD-10-CM | POA: Diagnosis not present

## 2022-09-01 DIAGNOSIS — M47816 Spondylosis without myelopathy or radiculopathy, lumbar region: Secondary | ICD-10-CM | POA: Diagnosis not present

## 2022-09-02 DIAGNOSIS — Z471 Aftercare following joint replacement surgery: Secondary | ICD-10-CM | POA: Diagnosis not present

## 2022-09-02 DIAGNOSIS — M25512 Pain in left shoulder: Secondary | ICD-10-CM | POA: Diagnosis not present

## 2022-09-02 DIAGNOSIS — M6281 Muscle weakness (generalized): Secondary | ICD-10-CM | POA: Diagnosis not present

## 2022-09-09 DIAGNOSIS — M25512 Pain in left shoulder: Secondary | ICD-10-CM | POA: Diagnosis not present

## 2022-09-09 DIAGNOSIS — Z471 Aftercare following joint replacement surgery: Secondary | ICD-10-CM | POA: Diagnosis not present

## 2022-09-09 DIAGNOSIS — M6281 Muscle weakness (generalized): Secondary | ICD-10-CM | POA: Diagnosis not present

## 2022-09-11 DIAGNOSIS — Z471 Aftercare following joint replacement surgery: Secondary | ICD-10-CM | POA: Diagnosis not present

## 2022-09-11 DIAGNOSIS — M6281 Muscle weakness (generalized): Secondary | ICD-10-CM | POA: Diagnosis not present

## 2022-09-11 DIAGNOSIS — M25512 Pain in left shoulder: Secondary | ICD-10-CM | POA: Diagnosis not present

## 2022-09-12 DIAGNOSIS — M47816 Spondylosis without myelopathy or radiculopathy, lumbar region: Secondary | ICD-10-CM | POA: Diagnosis not present

## 2022-09-12 DIAGNOSIS — S338XXA Sprain of other parts of lumbar spine and pelvis, initial encounter: Secondary | ICD-10-CM | POA: Diagnosis not present

## 2022-09-12 DIAGNOSIS — M9903 Segmental and somatic dysfunction of lumbar region: Secondary | ICD-10-CM | POA: Diagnosis not present

## 2022-09-15 DIAGNOSIS — M47816 Spondylosis without myelopathy or radiculopathy, lumbar region: Secondary | ICD-10-CM | POA: Diagnosis not present

## 2022-09-15 DIAGNOSIS — M9903 Segmental and somatic dysfunction of lumbar region: Secondary | ICD-10-CM | POA: Diagnosis not present

## 2022-09-15 DIAGNOSIS — S338XXA Sprain of other parts of lumbar spine and pelvis, initial encounter: Secondary | ICD-10-CM | POA: Diagnosis not present

## 2022-09-16 DIAGNOSIS — M25512 Pain in left shoulder: Secondary | ICD-10-CM | POA: Diagnosis not present

## 2022-09-16 DIAGNOSIS — M6281 Muscle weakness (generalized): Secondary | ICD-10-CM | POA: Diagnosis not present

## 2022-09-16 DIAGNOSIS — Z471 Aftercare following joint replacement surgery: Secondary | ICD-10-CM | POA: Diagnosis not present

## 2022-09-18 DIAGNOSIS — M6281 Muscle weakness (generalized): Secondary | ICD-10-CM | POA: Diagnosis not present

## 2022-09-18 DIAGNOSIS — M25512 Pain in left shoulder: Secondary | ICD-10-CM | POA: Diagnosis not present

## 2022-09-18 DIAGNOSIS — Z471 Aftercare following joint replacement surgery: Secondary | ICD-10-CM | POA: Diagnosis not present

## 2022-09-22 DIAGNOSIS — M9903 Segmental and somatic dysfunction of lumbar region: Secondary | ICD-10-CM | POA: Diagnosis not present

## 2022-09-22 DIAGNOSIS — S338XXA Sprain of other parts of lumbar spine and pelvis, initial encounter: Secondary | ICD-10-CM | POA: Diagnosis not present

## 2022-09-22 DIAGNOSIS — M47816 Spondylosis without myelopathy or radiculopathy, lumbar region: Secondary | ICD-10-CM | POA: Diagnosis not present

## 2022-09-29 DIAGNOSIS — M9903 Segmental and somatic dysfunction of lumbar region: Secondary | ICD-10-CM | POA: Diagnosis not present

## 2022-09-29 DIAGNOSIS — S338XXA Sprain of other parts of lumbar spine and pelvis, initial encounter: Secondary | ICD-10-CM | POA: Diagnosis not present

## 2022-09-29 DIAGNOSIS — M47816 Spondylosis without myelopathy or radiculopathy, lumbar region: Secondary | ICD-10-CM | POA: Diagnosis not present

## 2022-10-02 DIAGNOSIS — E039 Hypothyroidism, unspecified: Secondary | ICD-10-CM | POA: Diagnosis not present

## 2022-10-02 DIAGNOSIS — I10 Essential (primary) hypertension: Secondary | ICD-10-CM | POA: Diagnosis not present

## 2022-10-02 DIAGNOSIS — L570 Actinic keratosis: Secondary | ICD-10-CM | POA: Diagnosis not present

## 2022-10-02 DIAGNOSIS — J45909 Unspecified asthma, uncomplicated: Secondary | ICD-10-CM | POA: Diagnosis not present

## 2022-10-02 DIAGNOSIS — G3184 Mild cognitive impairment, so stated: Secondary | ICD-10-CM | POA: Diagnosis not present

## 2022-10-02 DIAGNOSIS — Z8673 Personal history of transient ischemic attack (TIA), and cerebral infarction without residual deficits: Secondary | ICD-10-CM | POA: Diagnosis not present

## 2022-10-03 DIAGNOSIS — J45909 Unspecified asthma, uncomplicated: Secondary | ICD-10-CM | POA: Diagnosis not present

## 2022-10-03 DIAGNOSIS — Z8673 Personal history of transient ischemic attack (TIA), and cerebral infarction without residual deficits: Secondary | ICD-10-CM | POA: Diagnosis not present

## 2022-10-03 DIAGNOSIS — Z131 Encounter for screening for diabetes mellitus: Secondary | ICD-10-CM | POA: Diagnosis not present

## 2022-10-03 DIAGNOSIS — I10 Essential (primary) hypertension: Secondary | ICD-10-CM | POA: Diagnosis not present

## 2022-10-03 DIAGNOSIS — L570 Actinic keratosis: Secondary | ICD-10-CM | POA: Diagnosis not present

## 2022-10-03 DIAGNOSIS — G3184 Mild cognitive impairment, so stated: Secondary | ICD-10-CM | POA: Diagnosis not present

## 2022-10-03 DIAGNOSIS — E039 Hypothyroidism, unspecified: Secondary | ICD-10-CM | POA: Diagnosis not present

## 2022-10-07 DIAGNOSIS — M47816 Spondylosis without myelopathy or radiculopathy, lumbar region: Secondary | ICD-10-CM | POA: Diagnosis not present

## 2022-10-07 DIAGNOSIS — S338XXA Sprain of other parts of lumbar spine and pelvis, initial encounter: Secondary | ICD-10-CM | POA: Diagnosis not present

## 2022-10-07 DIAGNOSIS — M9903 Segmental and somatic dysfunction of lumbar region: Secondary | ICD-10-CM | POA: Diagnosis not present

## 2022-10-14 DIAGNOSIS — M47816 Spondylosis without myelopathy or radiculopathy, lumbar region: Secondary | ICD-10-CM | POA: Diagnosis not present

## 2022-10-14 DIAGNOSIS — M9903 Segmental and somatic dysfunction of lumbar region: Secondary | ICD-10-CM | POA: Diagnosis not present

## 2022-10-14 DIAGNOSIS — S338XXA Sprain of other parts of lumbar spine and pelvis, initial encounter: Secondary | ICD-10-CM | POA: Diagnosis not present

## 2022-10-15 DIAGNOSIS — Z96652 Presence of left artificial knee joint: Secondary | ICD-10-CM | POA: Diagnosis not present

## 2022-10-15 DIAGNOSIS — M1711 Unilateral primary osteoarthritis, right knee: Secondary | ICD-10-CM | POA: Diagnosis not present

## 2022-10-21 DIAGNOSIS — M47816 Spondylosis without myelopathy or radiculopathy, lumbar region: Secondary | ICD-10-CM | POA: Diagnosis not present

## 2022-10-21 DIAGNOSIS — S338XXA Sprain of other parts of lumbar spine and pelvis, initial encounter: Secondary | ICD-10-CM | POA: Diagnosis not present

## 2022-10-21 DIAGNOSIS — M9903 Segmental and somatic dysfunction of lumbar region: Secondary | ICD-10-CM | POA: Diagnosis not present

## 2022-10-22 DIAGNOSIS — R2681 Unsteadiness on feet: Secondary | ICD-10-CM | POA: Diagnosis not present

## 2022-10-22 DIAGNOSIS — M6281 Muscle weakness (generalized): Secondary | ICD-10-CM | POA: Diagnosis not present

## 2022-10-22 DIAGNOSIS — Z9181 History of falling: Secondary | ICD-10-CM | POA: Diagnosis not present

## 2022-10-23 DIAGNOSIS — M1711 Unilateral primary osteoarthritis, right knee: Secondary | ICD-10-CM | POA: Diagnosis not present

## 2022-10-24 DIAGNOSIS — Z9181 History of falling: Secondary | ICD-10-CM | POA: Diagnosis not present

## 2022-10-24 DIAGNOSIS — M6281 Muscle weakness (generalized): Secondary | ICD-10-CM | POA: Diagnosis not present

## 2022-10-24 DIAGNOSIS — R2681 Unsteadiness on feet: Secondary | ICD-10-CM | POA: Diagnosis not present

## 2022-10-27 DIAGNOSIS — M47816 Spondylosis without myelopathy or radiculopathy, lumbar region: Secondary | ICD-10-CM | POA: Diagnosis not present

## 2022-10-27 DIAGNOSIS — M9903 Segmental and somatic dysfunction of lumbar region: Secondary | ICD-10-CM | POA: Diagnosis not present

## 2022-10-27 DIAGNOSIS — S338XXA Sprain of other parts of lumbar spine and pelvis, initial encounter: Secondary | ICD-10-CM | POA: Diagnosis not present

## 2022-10-28 DIAGNOSIS — Z9181 History of falling: Secondary | ICD-10-CM | POA: Diagnosis not present

## 2022-10-28 DIAGNOSIS — M6281 Muscle weakness (generalized): Secondary | ICD-10-CM | POA: Diagnosis not present

## 2022-10-28 DIAGNOSIS — R2681 Unsteadiness on feet: Secondary | ICD-10-CM | POA: Diagnosis not present

## 2022-10-30 DIAGNOSIS — M1711 Unilateral primary osteoarthritis, right knee: Secondary | ICD-10-CM | POA: Diagnosis not present

## 2022-10-31 DIAGNOSIS — Z9181 History of falling: Secondary | ICD-10-CM | POA: Diagnosis not present

## 2022-10-31 DIAGNOSIS — R2681 Unsteadiness on feet: Secondary | ICD-10-CM | POA: Diagnosis not present

## 2022-10-31 DIAGNOSIS — M6281 Muscle weakness (generalized): Secondary | ICD-10-CM | POA: Diagnosis not present

## 2022-10-31 IMAGING — CT CT HEAD W/O CM
3 of 6 series · 13 of 47 positions shown, 16 images · non-contrast
Comparison: CT head, 09/21/2020.

CLINICAL DATA: Head trauma.  Moderate to severe.

EXAM:
CT HEAD WITHOUT CONTRAST
TECHNIQUE: Contiguous axial images were obtained from the base of the skull
through the vertex without intravenous contrast.

[Series 2: head wo · axial · 0.47mm/px · z∈[-167,-32]mm · 9 of 33 slices shown, 12 images]
[im 3/33  brain]
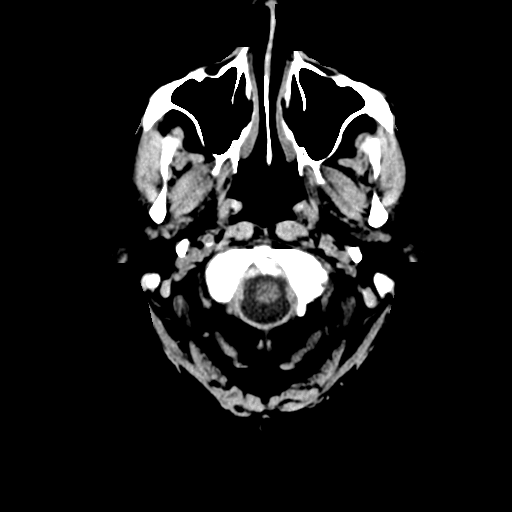
[im 3/33  bone]
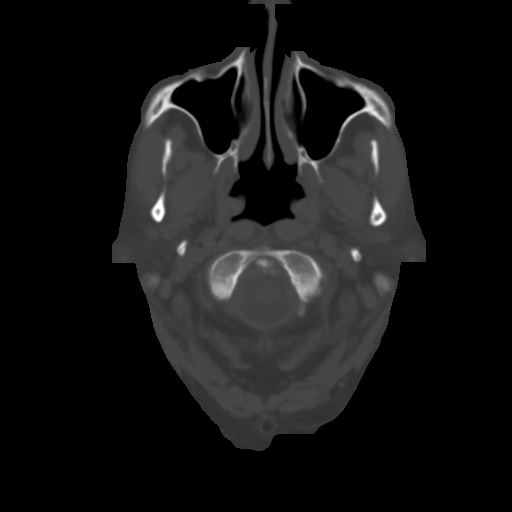
[im 6/33  brain]
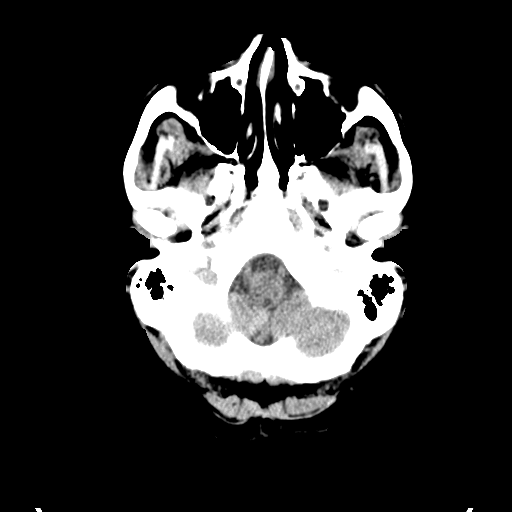
[im 10/33  brain]
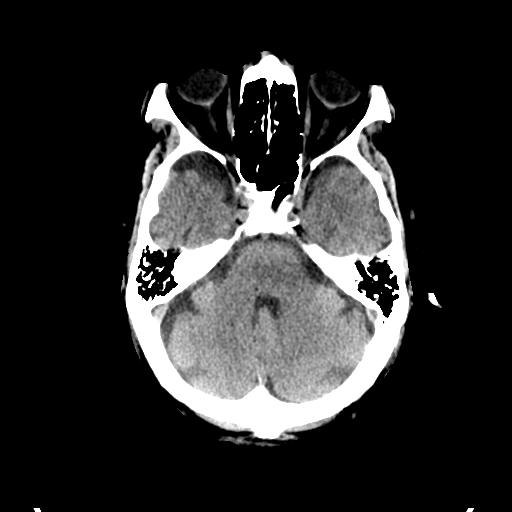
[im 13/33  brain]
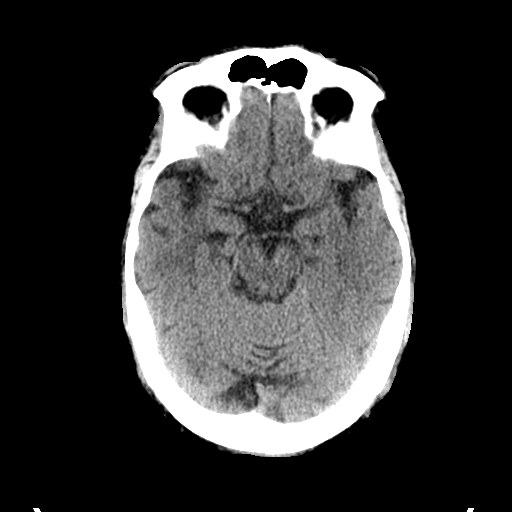
[im 17/33  brain]
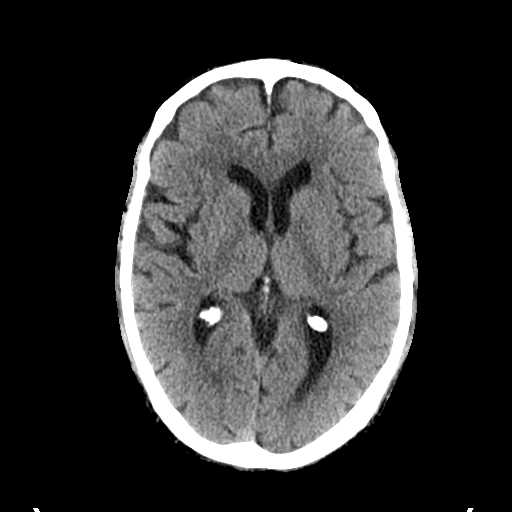
[im 17/33  bone]
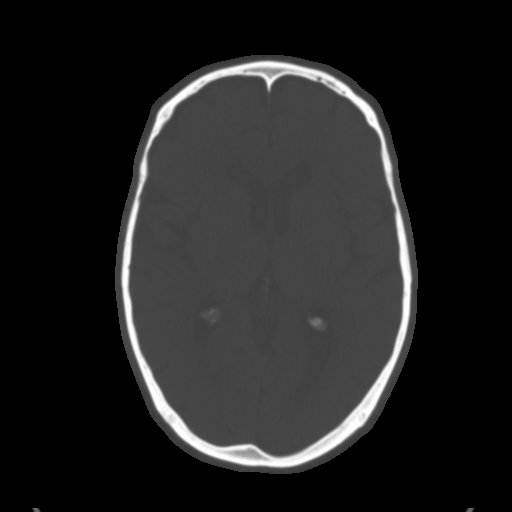
[im 21/33  brain]
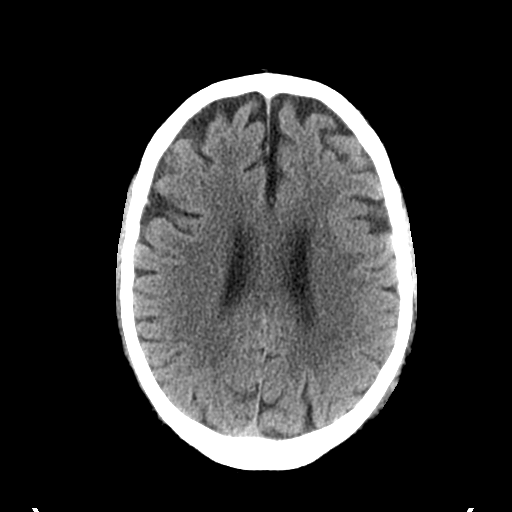
[im 23/33  brain]
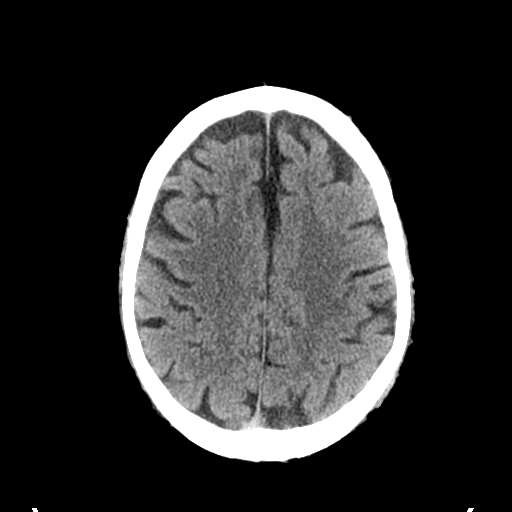
[im 27/33  brain]
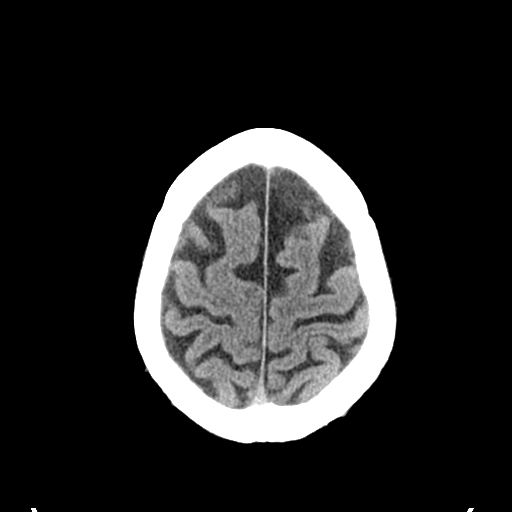
[im 30/33  brain]
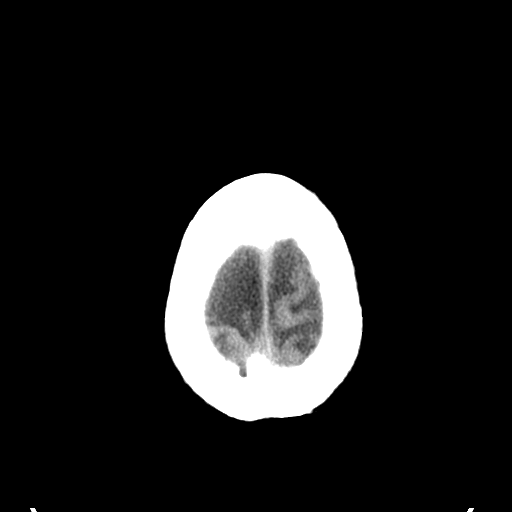
[im 30/33  bone]
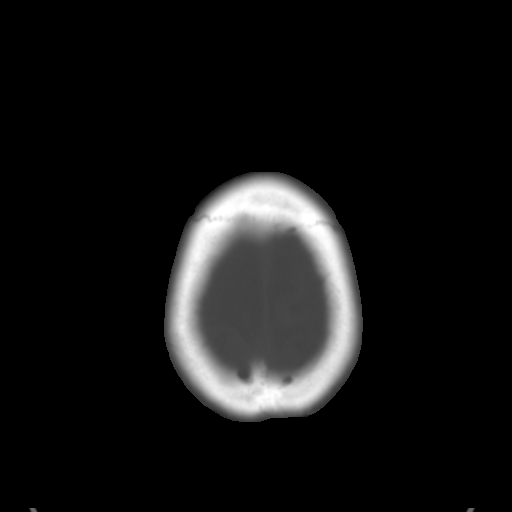

[Series 5: sagittal soft tissue · sagittal · 0.37mm/px · 2 of 57 slices shown]
[im 19/57  brain]
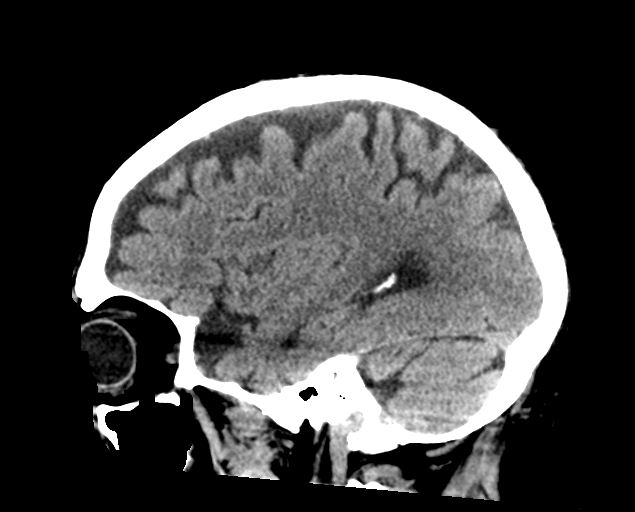
[im 38/57  brain]
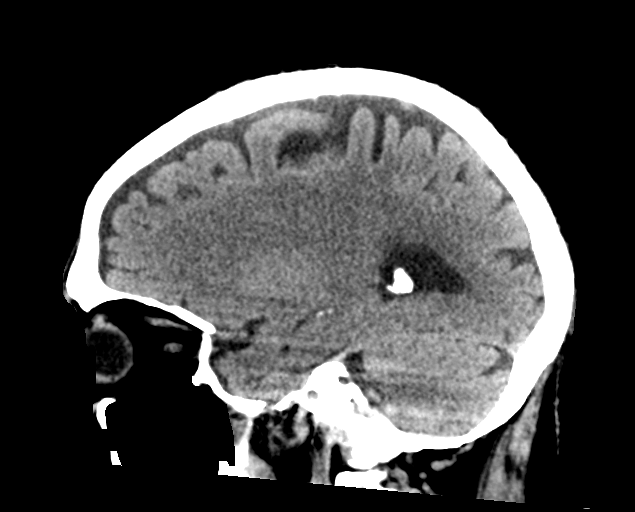

[Series 9: coronal soft tissue · coronal · 0.10mm/px · 2 of 49 slices shown]
[im 17/49  brain]
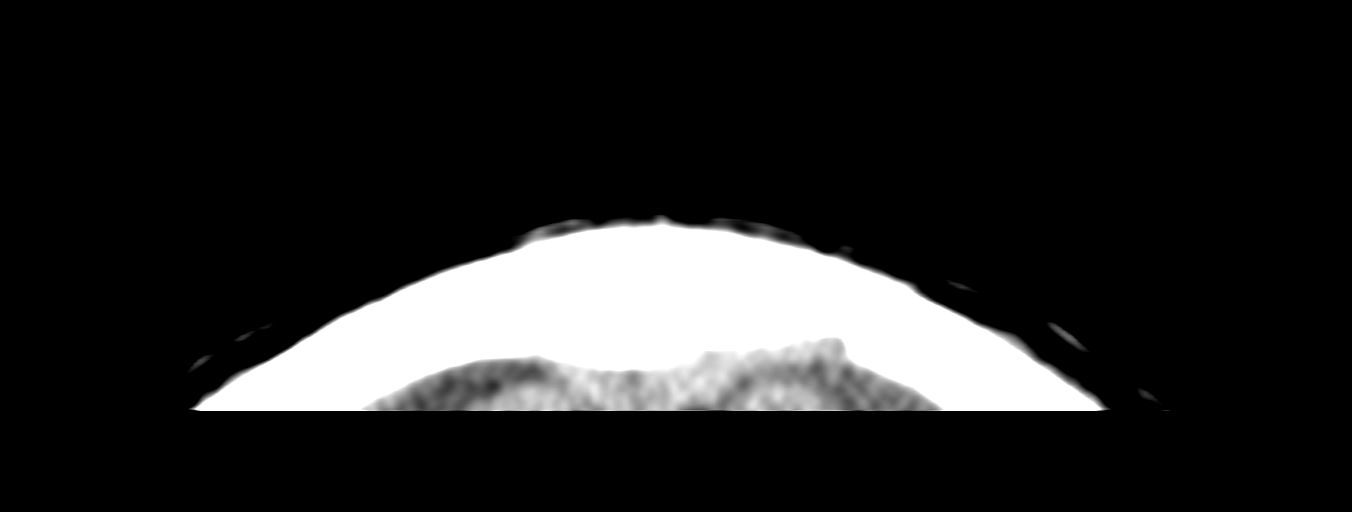
[im 33/49  brain]
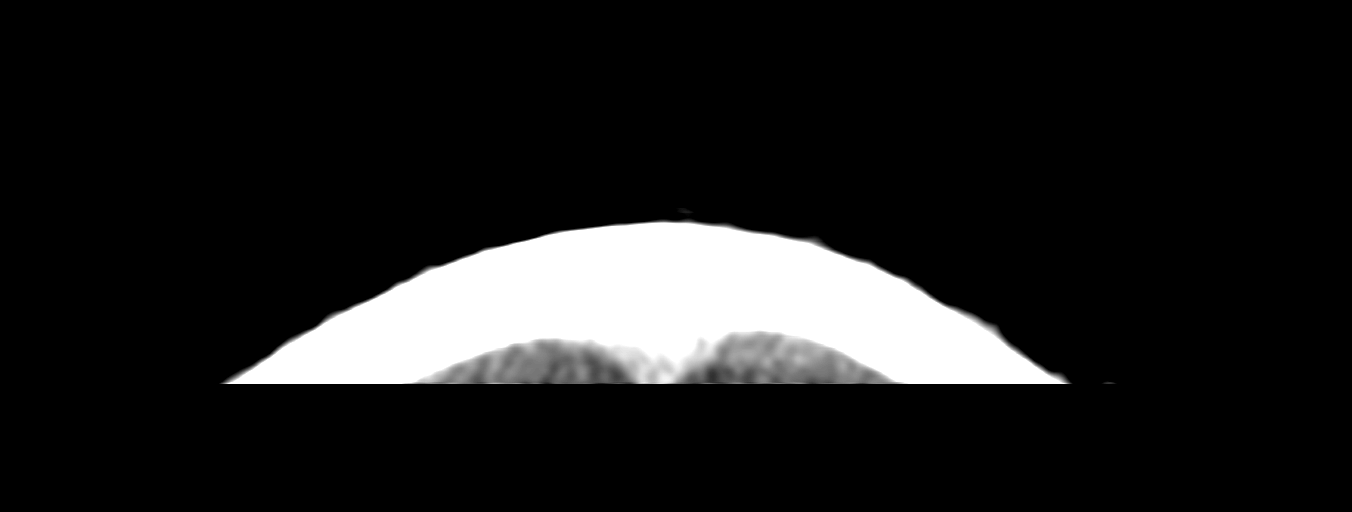

[13 of 47 positions shown; findings below may reference images not displayed]

FINDINGS: Brain: No evidence of acute infarction, hemorrhage, hydrocephalus,
extra-axial collection or mass lesion/mass effect.

Vascular: No hyperdense vessel or unexpected calcification.

Skull: Normal. Negative for fracture or focal lesion.

Sinuses/Orbits: No acute finding.

Other: mild cerebral volume volume loss, expected in patient this
age.
IMPRESSION: No acute intracranial abnormality.

## 2022-11-03 DIAGNOSIS — R2681 Unsteadiness on feet: Secondary | ICD-10-CM | POA: Diagnosis not present

## 2022-11-03 DIAGNOSIS — S338XXA Sprain of other parts of lumbar spine and pelvis, initial encounter: Secondary | ICD-10-CM | POA: Diagnosis not present

## 2022-11-03 DIAGNOSIS — Z9181 History of falling: Secondary | ICD-10-CM | POA: Diagnosis not present

## 2022-11-03 DIAGNOSIS — M47816 Spondylosis without myelopathy or radiculopathy, lumbar region: Secondary | ICD-10-CM | POA: Diagnosis not present

## 2022-11-03 DIAGNOSIS — M9903 Segmental and somatic dysfunction of lumbar region: Secondary | ICD-10-CM | POA: Diagnosis not present

## 2022-11-03 DIAGNOSIS — M6281 Muscle weakness (generalized): Secondary | ICD-10-CM | POA: Diagnosis not present

## 2022-11-05 DIAGNOSIS — M6281 Muscle weakness (generalized): Secondary | ICD-10-CM | POA: Diagnosis not present

## 2022-11-05 DIAGNOSIS — Z9181 History of falling: Secondary | ICD-10-CM | POA: Diagnosis not present

## 2022-11-05 DIAGNOSIS — R2681 Unsteadiness on feet: Secondary | ICD-10-CM | POA: Diagnosis not present

## 2022-11-06 DIAGNOSIS — M47816 Spondylosis without myelopathy or radiculopathy, lumbar region: Secondary | ICD-10-CM | POA: Diagnosis not present

## 2022-11-06 DIAGNOSIS — M9903 Segmental and somatic dysfunction of lumbar region: Secondary | ICD-10-CM | POA: Diagnosis not present

## 2022-11-06 DIAGNOSIS — S338XXA Sprain of other parts of lumbar spine and pelvis, initial encounter: Secondary | ICD-10-CM | POA: Diagnosis not present

## 2022-11-13 DIAGNOSIS — R2681 Unsteadiness on feet: Secondary | ICD-10-CM | POA: Diagnosis not present

## 2022-11-13 DIAGNOSIS — Z9181 History of falling: Secondary | ICD-10-CM | POA: Diagnosis not present

## 2022-11-13 DIAGNOSIS — M6281 Muscle weakness (generalized): Secondary | ICD-10-CM | POA: Diagnosis not present

## 2022-11-17 DIAGNOSIS — S338XXA Sprain of other parts of lumbar spine and pelvis, initial encounter: Secondary | ICD-10-CM | POA: Diagnosis not present

## 2022-11-17 DIAGNOSIS — M9903 Segmental and somatic dysfunction of lumbar region: Secondary | ICD-10-CM | POA: Diagnosis not present

## 2022-11-17 DIAGNOSIS — M47816 Spondylosis without myelopathy or radiculopathy, lumbar region: Secondary | ICD-10-CM | POA: Diagnosis not present

## 2022-11-18 DIAGNOSIS — R2681 Unsteadiness on feet: Secondary | ICD-10-CM | POA: Diagnosis not present

## 2022-11-18 DIAGNOSIS — M6281 Muscle weakness (generalized): Secondary | ICD-10-CM | POA: Diagnosis not present

## 2022-11-18 DIAGNOSIS — Z9181 History of falling: Secondary | ICD-10-CM | POA: Diagnosis not present

## 2022-11-25 DIAGNOSIS — Z9181 History of falling: Secondary | ICD-10-CM | POA: Diagnosis not present

## 2022-11-25 DIAGNOSIS — M6281 Muscle weakness (generalized): Secondary | ICD-10-CM | POA: Diagnosis not present

## 2022-11-25 DIAGNOSIS — R2681 Unsteadiness on feet: Secondary | ICD-10-CM | POA: Diagnosis not present

## 2022-12-01 DIAGNOSIS — S338XXA Sprain of other parts of lumbar spine and pelvis, initial encounter: Secondary | ICD-10-CM | POA: Diagnosis not present

## 2022-12-01 DIAGNOSIS — M9903 Segmental and somatic dysfunction of lumbar region: Secondary | ICD-10-CM | POA: Diagnosis not present

## 2022-12-01 DIAGNOSIS — M47816 Spondylosis without myelopathy or radiculopathy, lumbar region: Secondary | ICD-10-CM | POA: Diagnosis not present

## 2022-12-02 DIAGNOSIS — R2681 Unsteadiness on feet: Secondary | ICD-10-CM | POA: Diagnosis not present

## 2022-12-02 DIAGNOSIS — Z9181 History of falling: Secondary | ICD-10-CM | POA: Diagnosis not present

## 2022-12-02 DIAGNOSIS — M6281 Muscle weakness (generalized): Secondary | ICD-10-CM | POA: Diagnosis not present

## 2022-12-03 DIAGNOSIS — M47896 Other spondylosis, lumbar region: Secondary | ICD-10-CM | POA: Diagnosis not present

## 2022-12-03 DIAGNOSIS — Z79899 Other long term (current) drug therapy: Secondary | ICD-10-CM | POA: Diagnosis not present

## 2022-12-03 DIAGNOSIS — Z4789 Encounter for other orthopedic aftercare: Secondary | ICD-10-CM | POA: Diagnosis not present

## 2022-12-03 DIAGNOSIS — Z5181 Encounter for therapeutic drug level monitoring: Secondary | ICD-10-CM | POA: Diagnosis not present

## 2022-12-03 DIAGNOSIS — G894 Chronic pain syndrome: Secondary | ICD-10-CM | POA: Diagnosis not present

## 2022-12-08 DIAGNOSIS — Z79899 Other long term (current) drug therapy: Secondary | ICD-10-CM | POA: Diagnosis not present

## 2022-12-08 DIAGNOSIS — G894 Chronic pain syndrome: Secondary | ICD-10-CM | POA: Diagnosis not present

## 2022-12-08 DIAGNOSIS — M5459 Other low back pain: Secondary | ICD-10-CM | POA: Diagnosis not present

## 2022-12-11 DIAGNOSIS — M47816 Spondylosis without myelopathy or radiculopathy, lumbar region: Secondary | ICD-10-CM | POA: Diagnosis not present

## 2022-12-11 DIAGNOSIS — S338XXA Sprain of other parts of lumbar spine and pelvis, initial encounter: Secondary | ICD-10-CM | POA: Diagnosis not present

## 2022-12-11 DIAGNOSIS — M9903 Segmental and somatic dysfunction of lumbar region: Secondary | ICD-10-CM | POA: Diagnosis not present

## 2022-12-12 DIAGNOSIS — H353221 Exudative age-related macular degeneration, left eye, with active choroidal neovascularization: Secondary | ICD-10-CM | POA: Diagnosis not present

## 2022-12-18 DIAGNOSIS — R2681 Unsteadiness on feet: Secondary | ICD-10-CM | POA: Diagnosis not present

## 2022-12-18 DIAGNOSIS — M6281 Muscle weakness (generalized): Secondary | ICD-10-CM | POA: Diagnosis not present

## 2022-12-18 DIAGNOSIS — Z9181 History of falling: Secondary | ICD-10-CM | POA: Diagnosis not present

## 2022-12-22 DIAGNOSIS — M6281 Muscle weakness (generalized): Secondary | ICD-10-CM | POA: Diagnosis not present

## 2022-12-22 DIAGNOSIS — R2681 Unsteadiness on feet: Secondary | ICD-10-CM | POA: Diagnosis not present

## 2022-12-22 DIAGNOSIS — Z9181 History of falling: Secondary | ICD-10-CM | POA: Diagnosis not present

## 2022-12-25 DIAGNOSIS — H35033 Hypertensive retinopathy, bilateral: Secondary | ICD-10-CM | POA: Diagnosis not present

## 2022-12-25 DIAGNOSIS — H43813 Vitreous degeneration, bilateral: Secondary | ICD-10-CM | POA: Diagnosis not present

## 2022-12-25 DIAGNOSIS — H353231 Exudative age-related macular degeneration, bilateral, with active choroidal neovascularization: Secondary | ICD-10-CM | POA: Diagnosis not present

## 2022-12-30 DIAGNOSIS — S338XXA Sprain of other parts of lumbar spine and pelvis, initial encounter: Secondary | ICD-10-CM | POA: Diagnosis not present

## 2022-12-30 DIAGNOSIS — M47816 Spondylosis without myelopathy or radiculopathy, lumbar region: Secondary | ICD-10-CM | POA: Diagnosis not present

## 2022-12-30 DIAGNOSIS — M9903 Segmental and somatic dysfunction of lumbar region: Secondary | ICD-10-CM | POA: Diagnosis not present

## 2023-01-01 DIAGNOSIS — Z9181 History of falling: Secondary | ICD-10-CM | POA: Diagnosis not present

## 2023-01-01 DIAGNOSIS — M6281 Muscle weakness (generalized): Secondary | ICD-10-CM | POA: Diagnosis not present

## 2023-01-01 DIAGNOSIS — R2681 Unsteadiness on feet: Secondary | ICD-10-CM | POA: Diagnosis not present

## 2023-01-14 DIAGNOSIS — M47816 Spondylosis without myelopathy or radiculopathy, lumbar region: Secondary | ICD-10-CM | POA: Diagnosis not present

## 2023-01-14 DIAGNOSIS — M9903 Segmental and somatic dysfunction of lumbar region: Secondary | ICD-10-CM | POA: Diagnosis not present

## 2023-01-14 DIAGNOSIS — S338XXA Sprain of other parts of lumbar spine and pelvis, initial encounter: Secondary | ICD-10-CM | POA: Diagnosis not present

## 2023-01-15 DIAGNOSIS — M6281 Muscle weakness (generalized): Secondary | ICD-10-CM | POA: Diagnosis not present

## 2023-01-15 DIAGNOSIS — Z9181 History of falling: Secondary | ICD-10-CM | POA: Diagnosis not present

## 2023-01-15 DIAGNOSIS — R2681 Unsteadiness on feet: Secondary | ICD-10-CM | POA: Diagnosis not present

## 2023-01-21 DIAGNOSIS — R2681 Unsteadiness on feet: Secondary | ICD-10-CM | POA: Diagnosis not present

## 2023-01-21 DIAGNOSIS — M6281 Muscle weakness (generalized): Secondary | ICD-10-CM | POA: Diagnosis not present

## 2023-01-21 DIAGNOSIS — Z9181 History of falling: Secondary | ICD-10-CM | POA: Diagnosis not present

## 2023-01-22 DIAGNOSIS — H353231 Exudative age-related macular degeneration, bilateral, with active choroidal neovascularization: Secondary | ICD-10-CM | POA: Diagnosis not present

## 2023-01-22 DIAGNOSIS — H43813 Vitreous degeneration, bilateral: Secondary | ICD-10-CM | POA: Diagnosis not present

## 2023-01-22 DIAGNOSIS — H35033 Hypertensive retinopathy, bilateral: Secondary | ICD-10-CM | POA: Diagnosis not present

## 2023-01-22 DIAGNOSIS — H04123 Dry eye syndrome of bilateral lacrimal glands: Secondary | ICD-10-CM | POA: Diagnosis not present

## 2023-01-29 DIAGNOSIS — S338XXA Sprain of other parts of lumbar spine and pelvis, initial encounter: Secondary | ICD-10-CM | POA: Diagnosis not present

## 2023-01-29 DIAGNOSIS — M9903 Segmental and somatic dysfunction of lumbar region: Secondary | ICD-10-CM | POA: Diagnosis not present

## 2023-01-29 DIAGNOSIS — M47816 Spondylosis without myelopathy or radiculopathy, lumbar region: Secondary | ICD-10-CM | POA: Diagnosis not present

## 2023-02-05 DIAGNOSIS — M47816 Spondylosis without myelopathy or radiculopathy, lumbar region: Secondary | ICD-10-CM | POA: Diagnosis not present

## 2023-02-05 DIAGNOSIS — S338XXA Sprain of other parts of lumbar spine and pelvis, initial encounter: Secondary | ICD-10-CM | POA: Diagnosis not present

## 2023-02-05 DIAGNOSIS — M9903 Segmental and somatic dysfunction of lumbar region: Secondary | ICD-10-CM | POA: Diagnosis not present

## 2023-02-11 DIAGNOSIS — M47816 Spondylosis without myelopathy or radiculopathy, lumbar region: Secondary | ICD-10-CM | POA: Diagnosis not present

## 2023-02-11 DIAGNOSIS — M9903 Segmental and somatic dysfunction of lumbar region: Secondary | ICD-10-CM | POA: Diagnosis not present

## 2023-02-11 DIAGNOSIS — S338XXA Sprain of other parts of lumbar spine and pelvis, initial encounter: Secondary | ICD-10-CM | POA: Diagnosis not present

## 2023-03-03 DIAGNOSIS — M47816 Spondylosis without myelopathy or radiculopathy, lumbar region: Secondary | ICD-10-CM | POA: Diagnosis not present

## 2023-03-03 DIAGNOSIS — S338XXA Sprain of other parts of lumbar spine and pelvis, initial encounter: Secondary | ICD-10-CM | POA: Diagnosis not present

## 2023-03-03 DIAGNOSIS — M9903 Segmental and somatic dysfunction of lumbar region: Secondary | ICD-10-CM | POA: Diagnosis not present

## 2023-03-05 DIAGNOSIS — H04123 Dry eye syndrome of bilateral lacrimal glands: Secondary | ICD-10-CM | POA: Diagnosis not present

## 2023-03-05 DIAGNOSIS — H43813 Vitreous degeneration, bilateral: Secondary | ICD-10-CM | POA: Diagnosis not present

## 2023-03-05 DIAGNOSIS — H35033 Hypertensive retinopathy, bilateral: Secondary | ICD-10-CM | POA: Diagnosis not present

## 2023-03-05 DIAGNOSIS — H353231 Exudative age-related macular degeneration, bilateral, with active choroidal neovascularization: Secondary | ICD-10-CM | POA: Diagnosis not present

## 2023-03-06 DIAGNOSIS — Z23 Encounter for immunization: Secondary | ICD-10-CM | POA: Diagnosis not present

## 2023-03-09 DIAGNOSIS — M47816 Spondylosis without myelopathy or radiculopathy, lumbar region: Secondary | ICD-10-CM | POA: Diagnosis not present

## 2023-03-09 DIAGNOSIS — M9903 Segmental and somatic dysfunction of lumbar region: Secondary | ICD-10-CM | POA: Diagnosis not present

## 2023-03-09 DIAGNOSIS — S338XXA Sprain of other parts of lumbar spine and pelvis, initial encounter: Secondary | ICD-10-CM | POA: Diagnosis not present

## 2023-03-20 DIAGNOSIS — Z23 Encounter for immunization: Secondary | ICD-10-CM | POA: Diagnosis not present

## 2023-04-01 DIAGNOSIS — H353231 Exudative age-related macular degeneration, bilateral, with active choroidal neovascularization: Secondary | ICD-10-CM | POA: Diagnosis not present

## 2023-04-01 DIAGNOSIS — H04123 Dry eye syndrome of bilateral lacrimal glands: Secondary | ICD-10-CM | POA: Diagnosis not present

## 2023-04-01 DIAGNOSIS — H43813 Vitreous degeneration, bilateral: Secondary | ICD-10-CM | POA: Diagnosis not present

## 2023-04-01 DIAGNOSIS — H35033 Hypertensive retinopathy, bilateral: Secondary | ICD-10-CM | POA: Diagnosis not present

## 2023-04-30 DIAGNOSIS — H353231 Exudative age-related macular degeneration, bilateral, with active choroidal neovascularization: Secondary | ICD-10-CM | POA: Diagnosis not present

## 2023-04-30 DIAGNOSIS — H43813 Vitreous degeneration, bilateral: Secondary | ICD-10-CM | POA: Diagnosis not present

## 2023-04-30 DIAGNOSIS — H04123 Dry eye syndrome of bilateral lacrimal glands: Secondary | ICD-10-CM | POA: Diagnosis not present

## 2023-04-30 DIAGNOSIS — H35033 Hypertensive retinopathy, bilateral: Secondary | ICD-10-CM | POA: Diagnosis not present

## 2023-05-04 DIAGNOSIS — H53413 Scotoma involving central area, bilateral: Secondary | ICD-10-CM | POA: Diagnosis not present

## 2024-02-24 DEATH — deceased
